# Patient Record
Sex: Male | Born: 1960 | Race: Black or African American | Hispanic: No | Marital: Single
Health system: Southern US, Community
[De-identification: ages and names within clinical notes are randomized; demographics above are authoritative.]

## PROBLEM LIST (undated history)

## (undated) DIAGNOSIS — E079 Disorder of thyroid, unspecified: Secondary | ICD-10-CM

## (undated) DIAGNOSIS — I4891 Unspecified atrial fibrillation: Secondary | ICD-10-CM

## (undated) DIAGNOSIS — C801 Malignant (primary) neoplasm, unspecified: Secondary | ICD-10-CM

## (undated) HISTORY — PX: HEMORRHOIDECTOMY WITH HEMORRHOID BANDING: SHX5633

## (undated) HISTORY — PX: NECK SURGERY: SHX720

## (undated) HISTORY — PX: CARDIAC SURGERY: SHX584

---

## 2013-01-24 DIAGNOSIS — C109 Malignant neoplasm of oropharynx, unspecified: Secondary | ICD-10-CM | POA: Insufficient documentation

## 2014-01-31 DIAGNOSIS — E039 Hypothyroidism, unspecified: Secondary | ICD-10-CM | POA: Insufficient documentation

## 2016-05-19 DIAGNOSIS — I10 Essential (primary) hypertension: Secondary | ICD-10-CM | POA: Insufficient documentation

## 2016-11-17 DIAGNOSIS — I480 Paroxysmal atrial fibrillation: Secondary | ICD-10-CM | POA: Insufficient documentation

## 2016-11-17 DIAGNOSIS — I4891 Unspecified atrial fibrillation: Secondary | ICD-10-CM | POA: Insufficient documentation

## 2017-02-23 DIAGNOSIS — R001 Bradycardia, unspecified: Secondary | ICD-10-CM | POA: Insufficient documentation

## 2017-04-13 DIAGNOSIS — I483 Typical atrial flutter: Secondary | ICD-10-CM | POA: Insufficient documentation

## 2020-01-31 ENCOUNTER — Encounter (HOSPITAL_COMMUNITY): Payer: Self-pay

## 2020-01-31 ENCOUNTER — Other Ambulatory Visit: Payer: Self-pay

## 2020-01-31 ENCOUNTER — Ambulatory Visit (HOSPITAL_COMMUNITY)
Admission: EM | Admit: 2020-01-31 | Discharge: 2020-01-31 | Disposition: A | Discharge status: Home / Self Care | Payer: Self-pay

## 2020-01-31 ENCOUNTER — Emergency Department (HOSPITAL_COMMUNITY): Payer: 59

## 2020-01-31 ENCOUNTER — Emergency Department (HOSPITAL_COMMUNITY)
Admission: EM | Admit: 2020-01-31 | Discharge: 2020-01-31 | Disposition: A | Discharge status: Home / Self Care | Payer: 59 | Attending: Emergency Medicine | Admitting: Emergency Medicine

## 2020-01-31 DIAGNOSIS — Y92410 Unspecified street and highway as the place of occurrence of the external cause: Secondary | ICD-10-CM | POA: Diagnosis not present

## 2020-01-31 DIAGNOSIS — S161XXA Strain of muscle, fascia and tendon at neck level, initial encounter: Secondary | ICD-10-CM | POA: Diagnosis not present

## 2020-01-31 DIAGNOSIS — Z23 Encounter for immunization: Secondary | ICD-10-CM | POA: Insufficient documentation

## 2020-01-31 DIAGNOSIS — S40211A Abrasion of right shoulder, initial encounter: Secondary | ICD-10-CM | POA: Diagnosis not present

## 2020-01-31 DIAGNOSIS — S0001XA Abrasion of scalp, initial encounter: Secondary | ICD-10-CM | POA: Diagnosis not present

## 2020-01-31 DIAGNOSIS — R519 Headache, unspecified: Secondary | ICD-10-CM | POA: Diagnosis not present

## 2020-01-31 DIAGNOSIS — Z7901 Long term (current) use of anticoagulants: Secondary | ICD-10-CM | POA: Diagnosis not present

## 2020-01-31 DIAGNOSIS — S199XXA Unspecified injury of neck, initial encounter: Secondary | ICD-10-CM | POA: Diagnosis present

## 2020-01-31 HISTORY — DX: Disorder of thyroid, unspecified: E07.9

## 2020-01-31 HISTORY — DX: Malignant (primary) neoplasm, unspecified: C80.1

## 2020-01-31 LAB — I-STAT CHEM 8, ED
BUN: 28 mg/dL — ABNORMAL HIGH (ref 8–23)
Calcium, Ion: 1.14 mmol/L — ABNORMAL LOW (ref 1.15–1.40)
Chloride: 104 mmol/L (ref 98–111)
Creatinine, Ser: 1.6 mg/dL — ABNORMAL HIGH (ref 0.61–1.24)
Glucose, Bld: 111 mg/dL — ABNORMAL HIGH (ref 70–99)
HCT: 39 % (ref 39.0–52.0)
Hemoglobin: 13.3 g/dL (ref 13.0–17.0)
Potassium: 4 mmol/L (ref 3.5–5.1)
Sodium: 139 mmol/L (ref 135–145)
TCO2: 26 mmol/L (ref 22–32)

## 2020-01-31 LAB — CBC
HCT: 37.9 % — ABNORMAL LOW (ref 39.0–52.0)
Hemoglobin: 11.9 g/dL — ABNORMAL LOW (ref 13.0–17.0)
MCH: 25.8 pg — ABNORMAL LOW (ref 26.0–34.0)
MCHC: 31.4 g/dL (ref 30.0–36.0)
MCV: 82 fL (ref 80.0–100.0)
Platelets: 328 10*3/uL (ref 150–400)
RBC: 4.62 MIL/uL (ref 4.22–5.81)
RDW: 13.6 % (ref 11.5–15.5)
WBC: 8.6 10*3/uL (ref 4.0–10.5)
nRBC: 0 % (ref 0.0–0.2)

## 2020-01-31 LAB — COMPREHENSIVE METABOLIC PANEL
ALT: 19 U/L (ref 0–44)
AST: 28 U/L (ref 15–41)
Albumin: 4.1 g/dL (ref 3.5–5.0)
Alkaline Phosphatase: 44 U/L (ref 38–126)
Anion gap: 9 (ref 5–15)
BUN: 24 mg/dL — ABNORMAL HIGH (ref 8–23)
CO2: 28 mmol/L (ref 22–32)
Calcium: 10.1 mg/dL (ref 8.9–10.3)
Chloride: 102 mmol/L (ref 98–111)
Creatinine, Ser: 1.63 mg/dL — ABNORMAL HIGH (ref 0.61–1.24)
GFR calc non Af Amer: 45 mL/min — ABNORMAL LOW (ref >60)
Glucose, Bld: 128 mg/dL — ABNORMAL HIGH (ref 70–99)
Potassium: 3.9 mmol/L (ref 3.5–5.1)
Sodium: 139 mmol/L (ref 135–145)
Total Bilirubin: 0.3 mg/dL (ref 0.3–1.2)
Total Protein: 7.5 g/dL (ref 6.5–8.1)

## 2020-01-31 LAB — PROTIME-INR
INR: 1.3 — ABNORMAL HIGH (ref 0.8–1.2)
Prothrombin Time: 15.6 seconds — ABNORMAL HIGH (ref 11.4–15.2)

## 2020-01-31 LAB — URINALYSIS, ROUTINE W REFLEX MICROSCOPIC
Bilirubin Urine: NEGATIVE
Glucose, UA: NEGATIVE mg/dL
Hgb urine dipstick: NEGATIVE
Ketones, ur: NEGATIVE mg/dL
Leukocytes,Ua: NEGATIVE
Nitrite: NEGATIVE
Protein, ur: NEGATIVE mg/dL
Specific Gravity, Urine: 1.012 (ref 1.005–1.030)
pH: 5 (ref 5.0–8.0)

## 2020-01-31 LAB — ETHANOL: Alcohol, Ethyl (B): <10 mg/dL (ref <10)

## 2020-01-31 LAB — SAMPLE TO BLOOD BANK

## 2020-01-31 MED ORDER — TETANUS-DIPHTH-ACELL PERTUSSIS 5-2.5-18.5 LF-MCG/0.5 IM SUSP
0.5000 mL | Freq: Once | INTRAMUSCULAR | Status: AC
  Administered 2020-01-31: 0.5 mL via INTRAMUSCULAR
  Filled 2020-01-31: qty 0.5

## 2020-01-31 MED ORDER — MORPHINE SULFATE (PF) 4 MG/ML IV SOLN
4.0000 mg | Freq: Once | INTRAVENOUS | Status: AC
  Administered 2020-01-31: 4 mg via INTRAVENOUS
  Filled 2020-01-31: qty 1

## 2020-01-31 NOTE — ED Notes (Signed)
Pt d.c by MD & is giving d.c instructions and follow up care, Pt out of The ED ambulatory

## 2020-01-31 NOTE — ED Notes (Signed)
Patient transported to CT 
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
## 2020-01-31 NOTE — Discharge Instructions (Signed)
You did not have any injuries on your x-rays and CT scans.  You may take 2 extra strength Tylenol and ibuprofen every 6-8 hours for pain at home.  You may also use ice on the neck for 15 minutes at a time to help with pain and swelling.  You will likely feel worse tomorrow, but pain should start to improve in the next several days.  Please follow-up with your primary care doctor in a week's time if you are not feeling better.
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
## 2020-03-17 ENCOUNTER — Encounter: Payer: Self-pay | Admitting: Podiatry

## 2020-03-17 ENCOUNTER — Ambulatory Visit (INDEPENDENT_AMBULATORY_CARE_PROVIDER_SITE_OTHER): Payer: 59 | Admitting: Podiatry

## 2020-03-17 ENCOUNTER — Other Ambulatory Visit: Payer: Self-pay

## 2020-03-17 DIAGNOSIS — Z7901 Long term (current) use of anticoagulants: Secondary | ICD-10-CM | POA: Diagnosis not present

## 2020-03-17 DIAGNOSIS — E1142 Type 2 diabetes mellitus with diabetic polyneuropathy: Secondary | ICD-10-CM

## 2020-03-17 DIAGNOSIS — M79674 Pain in right toe(s): Secondary | ICD-10-CM | POA: Diagnosis not present

## 2020-03-17 DIAGNOSIS — M79675 Pain in left toe(s): Secondary | ICD-10-CM

## 2020-03-17 DIAGNOSIS — B351 Tinea unguium: Secondary | ICD-10-CM | POA: Diagnosis not present

## 2020-03-17 NOTE — Progress Notes (Signed)
  Subjective:  Patient ID: Gary Lawrence, male    DOB: 1960-08-30,  MRN: [id number]  Chief Complaint  Patient presents with  . routine foot care    nail trim     59 y.o. male presents with the above complaint. History confirmed with patient.  He has thickened discolored and uncomfortable toenails.  He is a type II diabetic and thinks his A1c is "pretty good".  Does not know the number.  He takes Xarelto for cardiac issues.  He last saw his PCP in October 2021  Objective:  Physical Exam: warm, good capillary refill, no trophic changes or ulcerative lesions and normal DP and PT pulses.  Decreased protective sensation at tips of toes.  Onychomycosis bilaterally 1, 2, 5 and 3 on the right side  Assessment:  No diagnosis found.   Plan:  Patient was evaluated and treated and all questions answered.  Discussed the etiology and treatment options for the condition in detail with the patient. Educated patient on the topical and oral treatment options for mycotic nails. Recommended debridement of the nails today. Sharp and mechanical debridement performed of all painful and mycotic nails today. Nails debrided in length and thickness using a nail nipper and a mechanical burr to level of comfort. Discussed treatment options including appropriate shoe gear. Follow up as needed for painful nails.  Patient educated on diabetes. Discussed proper diabetic foot care and discussed risks and complications of disease. Educated patient in depth on reasons to return to the office immediately should he/she discover anything concerning or new on the feet. All questions answered. Discussed proper shoes as well.    Return in about 3 months (around 06/17/2020).

## 2020-04-24 ENCOUNTER — Emergency Department (HOSPITAL_COMMUNITY): Payer: 59

## 2020-04-24 ENCOUNTER — Encounter (HOSPITAL_COMMUNITY): Payer: Self-pay | Admitting: *Deleted

## 2020-04-24 ENCOUNTER — Emergency Department (HOSPITAL_COMMUNITY)
Admission: EM | Admit: 2020-04-24 | Discharge: 2020-04-24 | Disposition: A | Discharge status: Home / Self Care | Payer: 59 | Attending: Emergency Medicine | Admitting: Emergency Medicine

## 2020-04-24 DIAGNOSIS — Y92410 Unspecified street and highway as the place of occurrence of the external cause: Secondary | ICD-10-CM | POA: Insufficient documentation

## 2020-04-24 DIAGNOSIS — I10 Essential (primary) hypertension: Secondary | ICD-10-CM | POA: Diagnosis not present

## 2020-04-24 DIAGNOSIS — S01112A Laceration without foreign body of left eyelid and periocular area, initial encounter: Secondary | ICD-10-CM | POA: Insufficient documentation

## 2020-04-24 DIAGNOSIS — S0990XA Unspecified injury of head, initial encounter: Secondary | ICD-10-CM | POA: Diagnosis present

## 2020-04-24 DIAGNOSIS — S00511A Abrasion of lip, initial encounter: Secondary | ICD-10-CM | POA: Insufficient documentation

## 2020-04-24 DIAGNOSIS — E119 Type 2 diabetes mellitus without complications: Secondary | ICD-10-CM | POA: Insufficient documentation

## 2020-04-24 DIAGNOSIS — T1490XA Injury, unspecified, initial encounter: Secondary | ICD-10-CM

## 2020-04-24 DIAGNOSIS — K429 Umbilical hernia without obstruction or gangrene: Secondary | ICD-10-CM | POA: Diagnosis not present

## 2020-04-24 HISTORY — DX: Unspecified atrial fibrillation: I48.91

## 2020-04-24 LAB — COMPREHENSIVE METABOLIC PANEL
ALT: 26 U/L (ref 0–44)
AST: 30 U/L (ref 15–41)
Albumin: 3.9 g/dL (ref 3.5–5.0)
Alkaline Phosphatase: 53 U/L (ref 38–126)
Anion gap: 10 (ref 5–15)
BUN: 19 mg/dL (ref 8–23)
CO2: 24 mmol/L (ref 22–32)
Calcium: 9.7 mg/dL (ref 8.9–10.3)
Chloride: 105 mmol/L (ref 98–111)
Creatinine, Ser: 1.54 mg/dL — ABNORMAL HIGH (ref 0.61–1.24)
GFR, Estimated: 51 mL/min — ABNORMAL LOW (ref >60)
Glucose, Bld: 163 mg/dL — ABNORMAL HIGH (ref 70–99)
Potassium: 3.6 mmol/L (ref 3.5–5.1)
Sodium: 139 mmol/L (ref 135–145)
Total Bilirubin: 0.6 mg/dL (ref 0.3–1.2)
Total Protein: 6.9 g/dL (ref 6.5–8.1)

## 2020-04-24 LAB — SAMPLE TO BLOOD BANK

## 2020-04-24 LAB — URINALYSIS, ROUTINE W REFLEX MICROSCOPIC
Bacteria, UA: NONE SEEN
Bilirubin Urine: NEGATIVE
Glucose, UA: NEGATIVE mg/dL
Hgb urine dipstick: NEGATIVE
Ketones, ur: NEGATIVE mg/dL
Leukocytes,Ua: NEGATIVE
Nitrite: NEGATIVE
Protein, ur: 100 mg/dL — AB
Specific Gravity, Urine: 1.03 (ref 1.005–1.030)
pH: 5 (ref 5.0–8.0)

## 2020-04-24 LAB — CBC
HCT: 33.4 % — ABNORMAL LOW (ref 39.0–52.0)
Hemoglobin: 10.9 g/dL — ABNORMAL LOW (ref 13.0–17.0)
MCH: 26.8 pg (ref 26.0–34.0)
MCHC: 32.6 g/dL (ref 30.0–36.0)
MCV: 82.3 fL (ref 80.0–100.0)
Platelets: 317 10*3/uL (ref 150–400)
RBC: 4.06 MIL/uL — ABNORMAL LOW (ref 4.22–5.81)
RDW: 14.7 % (ref 11.5–15.5)
WBC: 6.8 10*3/uL (ref 4.0–10.5)
nRBC: 0 % (ref 0.0–0.2)

## 2020-04-24 LAB — I-STAT CHEM 8, ED
BUN: 21 mg/dL (ref 8–23)
Calcium, Ion: 1.24 mmol/L (ref 1.15–1.40)
Chloride: 106 mmol/L (ref 98–111)
Creatinine, Ser: 1.4 mg/dL — ABNORMAL HIGH (ref 0.61–1.24)
Glucose, Bld: 155 mg/dL — ABNORMAL HIGH (ref 70–99)
HCT: 35 % — ABNORMAL LOW (ref 39.0–52.0)
Hemoglobin: 11.9 g/dL — ABNORMAL LOW (ref 13.0–17.0)
Potassium: 3.6 mmol/L (ref 3.5–5.1)
Sodium: 142 mmol/L (ref 135–145)
TCO2: 23 mmol/L (ref 22–32)

## 2020-04-24 LAB — ETHANOL: Alcohol, Ethyl (B): <10 mg/dL (ref <10)

## 2020-04-24 LAB — LACTIC ACID, PLASMA: Lactic Acid, Venous: 2.3 mmol/L (ref 0.5–1.9)

## 2020-04-24 LAB — PROTIME-INR
INR: 1 (ref 0.8–1.2)
Prothrombin Time: 13.2 seconds (ref 11.4–15.2)

## 2020-04-24 MED ORDER — LIDOCAINE HCL (PF) 1 % IJ SOLN
30.0000 mL | Freq: Once | INTRAMUSCULAR | Status: AC
  Administered 2020-04-24: 30 mL
  Filled 2020-04-24: qty 30

## 2020-04-24 MED ORDER — CEPHALEXIN 500 MG PO CAPS: 500.0000 mg | ORAL_CAPSULE | Freq: Four times a day (QID) | ORAL | 0 refills | Status: AC

## 2020-04-24 MED ORDER — IOHEXOL 350 MG/ML SOLN
100.0000 mL | Freq: Once | INTRAVENOUS | Status: AC | PRN
  Administered 2020-04-24: 100 mL via INTRAVENOUS

## 2020-04-24 NOTE — Discharge Instructions (Addendum)
Thank you for choosing [facility] for your care today.  To do: Your sutures are absorbable - they will dissolve on their own over the next 7-14 days.  You do not need to come back to have them removed.  Do not submerge your head in water or vigorously scrub this area until the sutures are dissolved. You can take tylenol every 6 hours for pain. You will probably feel more sore tomorrow and over the next few days than you do today.  Your kidney mass was noted on imaging again today. You should follow up with your primary care provider regarding this finding to plan follow up and any more imaging. Please follow up with your primary care doctor in the next few days. If you do not have a PCP you are established with, you can call [phone]  or [phone]  to access [facility] Find A Doctor service. You can also visit [email or web address] Please come back to the Emergency Department if you have shortness of breath, chest pain, confusion/mental status changes, if you have so much nausea/vomiting that you cannot keep down fluids, or if you have any other symptoms that worry you.  Take care. Hope you start feeling better soon.  Corliss Blacker, MD Emergency Medicine

## 2020-04-24 NOTE — Progress Notes (Signed)
Orthopedic Tech Progress Note Patient Details:  Lyfe Monger Dec 31, 1960 [id number]          Michelle Piper 04/24/2020, 6:13 PM
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
## 2020-04-24 NOTE — ED Notes (Signed)
Patient transported to X-ray 
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
## 2020-04-24 NOTE — Progress Notes (Signed)
Orthopedic Tech Progress Note Patient Details:  Gary Lawrence 27-Aug-1960 [id number]  Ortho Devices Type of Ortho Device: Sling immobilizer Ortho Device/Splint Location: RUE Ortho Device/Splint Interventions: Application,Adjustment   Post Interventions Patient Tolerated: Well Instructions Provided: Adjustment of device   Gary Lawrence E Dejon Lukas 04/24/2020, 10:26 PM
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
## 2020-04-24 NOTE — ED Notes (Signed)
Pt's brother called to get an update. Callback number is [phone], Dorinda Hill
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
## 2020-04-24 NOTE — ED Notes (Signed)
C-collar applied

## 2020-05-28 ENCOUNTER — Encounter: Payer: Self-pay | Admitting: Gastroenterology

## 2020-05-28 ENCOUNTER — Ambulatory Visit: Payer: Medicaid Other | Admitting: Gastroenterology

## 2020-05-28 ENCOUNTER — Other Ambulatory Visit: Payer: Self-pay

## 2020-05-28 VITALS — BP 120/80 | HR 62 | Ht 79.0 in | Wt 236.0 lb

## 2020-05-28 DIAGNOSIS — Z86010 Personal history of colonic polyps: Secondary | ICD-10-CM

## 2020-05-28 MED ORDER — SUPREP BOWEL PREP KIT 17.5-3.13-1.6 GM/177ML PO SOLN: 1.0000 | ORAL | 0 refills | Status: DC

## 2020-05-28 NOTE — Progress Notes (Signed)
HPI: This is a very pleasant 60 year old man who was referred to me by Drue Flirt, MD  to evaluate history of rectal polyp.    He was referred by his primary care physician.  I reviewed an office note from October 2021 which reads "colonoscopy-first done in 2019 and then repeat last year, both while incarcerated (records not available) and he was told he needs to get another this year in 2021 however unsure why"  He is on Xarelto for atrial fibrillation.  He brought with him a copy of a colonoscopy report from [facility].  Colonoscopy 11/2018.  Findings "a 35 mm polyp was found in the proximal rectum at 10 cm from the anal verge distal to the site of previous tattooing.  The polyp was pedunculated.  The polyp was removed with a hot snare.  Resection and retrieval were complete.  To prevent bleeding the polypectomy tissue edges were approximated and 112 mm over the scope clip was successfully placed.  There was no bleeding during the procedure.  The area 2 cm distal to the polypectomy site was tattooed with an injection of spot.  The examination was to the cecum."  No pathology results were available at the time of this visit  Colon cancer does not run in his family.  He does have intermittent bright red blood per rectum and says he has drainage from his bottom which requires him to wear an adult diaper periodically.  Review of systems: Pertinent positive and negative review of systems were noted in the above HPI section. All other review negative.   Past Medical History:  Diagnosis Date  . A-fib ([place])   . Cancer ([place])   . Thyroid disease     Past Surgical History:  Procedure Laterality Date  . CARDIAC SURGERY      Current Outpatient Medications  Medication Sig Dispense Refill  . acetaminophen (TYLENOL) 325 MG suppository Place 325 mg rectally every 4 (four) hours as needed.    Marland Kitchen acetaminophen (TYLENOL) 650 MG CR tablet Take 650 mg by mouth every 8 (eight) hours as needed for  pain.    Marland Kitchen amLODipine (NORVASC) 10 MG tablet Take 10 mg by mouth daily.    . brimonidine (ALPHAGAN) 0.15 % ophthalmic solution SMARTSIG:In Eye(s)    . chlorthalidone (HYGROTON) 25 MG tablet Take 25 mg by mouth daily.    . Cholecalciferol (VITAMIN D3) 25 MCG (1000 UT) CAPS Take 1 capsule by mouth daily.    . enalapril (VASOTEC) 10 MG tablet Take by mouth.    . fenofibrate 54 MG tablet Take 54 mg by mouth daily.    . ferrous sulfate 324 MG TBEC Take 324 mg by mouth.    . fluticasone (FLONASE) 50 MCG/ACT nasal spray Place 1 spray into both nostrils daily.    Marland Kitchen glipiZIDE (GLUCOTROL XL) 2.5 MG 24 hr tablet Take 2.5 mg by mouth daily.    . metFORMIN (GLUCOPHAGE) 1000 MG tablet Take 1,000 mg by mouth 2 (two) times daily.    . metoprolol tartrate (LOPRESSOR) 100 MG tablet Take 100 mg by mouth 2 (two) times daily.    . niacin 500 MG tablet Take 500 mg by mouth at bedtime.    . pantoprazole (PROTONIX) 40 MG tablet Take 40 mg by mouth daily.    . rivaroxaban (XARELTO) 10 MG TABS tablet Take 10 mg by mouth daily.    . timolol (BETIMOL) 0.5 % ophthalmic solution 1 drop 2 (two) times daily.  No current facility-administered medications for this visit.    Allergies as of 05/28/2020  . (No Known Allergies)    History reviewed. No pertinent family history.  Social History   Socioeconomic History  . Marital status: Single    Spouse name: Not on file  . Number of children: Not on file  . Years of education: Not on file  . Highest education level: Not on file  Occupational History  . Not on file  Tobacco Use  . Smoking status: Never Smoker  . Smokeless tobacco: Not on file  Substance and Sexual Activity  . Alcohol use: Never  . Drug use: Never  . Sexual activity: Not on file  Other Topics Concern  . Not on file  Social History Narrative   ** Merged History Encounter **       Social Determinants of Health   Financial Resource Strain: Not on file  Food Insecurity: Not on file   Transportation Needs: Not on file  Physical Activity: Not on file  Stress: Not on file  Social Connections: Not on file  Intimate Partner Violence: Not on file     Physical Exam: Ht 6\' 7"  (2.007 m)   Wt 236 lb (107 kg)   BMI 26.59 kg/m  Constitutional: generally well-appearing Psychiatric: alert and oriented x3 Eyes: extraocular movements intact Mouth: oral pharynx moist, no lesions Neck: supple no lymphadenopathy Cardiovascular: heart regular rate and rhythm Lungs: clear to auscultation bilaterally Abdomen: soft, nontender, nondistended, no obvious ascites, no peritoneal signs, normal bowel sounds Extremities: no lower extremity edema bilaterally Skin: no lesions on visible extremities Rectal exam deferred for upcoming colonoscopy  Assessment and plan: 60 y.o. male with history of 3.5 cm rectal polyp removed at [facility] in 2020  It would be very helpful to have his previous pathology reports for [facility] and we will strive to get those results here.  Clearly he needs another colonoscopy to inspect that site as well as the rest of his colon we will arrange for that to be done at his soonest convenience.  He is on a blood thinner Xarelto and knows that that will cause him to be at increased risk for procedure related bleeding and so I asked that he hold it for 2 days prior to his colonoscopy.  We check with his [facility] cardiologist to make sure that he is okay with that recommendation.  Please see the "Patient Instructions" section for addition details about the plan.   Gary Loffler, MD [facility] 05/28/2020, 9:51 AM  Cc: Drue Flirt, MD  Total time on date of encounter was 45 minutes (this included time spent preparing to see the patient reviewing records; obtaining and/or reviewing separately obtained history; performing a medically appropriate exam and/or evaluation; counseling and educating the patient and family if present; ordering medications, tests or procedures  if applicable; and documenting clinical information in the health record).

## 2020-05-28 NOTE — Patient Instructions (Addendum)
If you are age 62 or younger, your body mass index should be between 19-25. Your Body mass index is 26.59 kg/m. If this is out of the aformentioned range listed, please consider follow up with your Primary Care Provider.   You have been scheduled for a colonoscopy. Please follow written instructions given to you at your visit today.  Please pick up your prep supplies at the pharmacy within the next 1-3 days. If you use inhalers (even only as needed), please bring them with you on the day of your procedure.  Due to recent changes in healthcare laws, you may see the results of your imaging and laboratory studies on MyChart before your provider has had a chance to review them.  We understand that in some cases there may be results that are confusing or concerning to you. Not all laboratory results come back in the same time frame and the provider may be waiting for multiple results in order to interpret others.  Please give [place] 48 hours in order for your provider to thoroughly review all the results before contacting the office for clarification of your results.   We will try to obtain pathology report from [facility] for Colonoscopy done in 2020.  Thank you for entrusting me with your care and choosing [facility].  Dr Ardis Hughs

## 2020-05-29 ENCOUNTER — Telehealth: Payer: Self-pay

## 2020-05-29 NOTE — Telephone Encounter (Signed)
   Gary Lawrence July 09, 1960 [id number]  Dear Dr Luana Shu,   We have scheduled the above named patient for a(n) colonoscopu procedure. Our records show that (s)he is on anticoagulation therapy.  Please advise as to whether the patient may come off their therapy of Xarelto for 2 days prior to their procedure which is scheduled for 07-15-2020.  Please fax response to [phone].  ATTNElmyra Ricks, CMA  Sincerely,    [facility]

## 2020-06-10 ENCOUNTER — Telehealth: Payer: Self-pay | Admitting: Gastroenterology

## 2020-06-10 NOTE — Telephone Encounter (Signed)
Colonoscopy August 2020 [facility] indication "therapeutic procedure for known rectal mass" findings 135 mm polyp in the rectum, removed with a hot snare, resected and retrieved.  Treated with a hot snare.  Clip (MR conditional and (was placed.  Tattooed.  This examination was to the cecum.  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  The lateral and deep resection margins are negative for adenoma and for high-grade dysplasia".  It looks like he was recommended to have a repeat colonoscopy at 1 year interval.

## 2020-06-23 ENCOUNTER — Encounter: Payer: Self-pay | Admitting: Podiatry

## 2020-06-23 ENCOUNTER — Ambulatory Visit (INDEPENDENT_AMBULATORY_CARE_PROVIDER_SITE_OTHER): Payer: Medicaid Other | Admitting: Podiatry

## 2020-06-23 ENCOUNTER — Other Ambulatory Visit: Payer: Self-pay

## 2020-06-23 DIAGNOSIS — M79675 Pain in left toe(s): Secondary | ICD-10-CM

## 2020-06-23 DIAGNOSIS — M79674 Pain in right toe(s): Secondary | ICD-10-CM

## 2020-06-23 DIAGNOSIS — B351 Tinea unguium: Secondary | ICD-10-CM | POA: Diagnosis not present

## 2020-06-23 DIAGNOSIS — Z7901 Long term (current) use of anticoagulants: Secondary | ICD-10-CM

## 2020-06-23 DIAGNOSIS — E1142 Type 2 diabetes mellitus with diabetic polyneuropathy: Secondary | ICD-10-CM | POA: Diagnosis not present

## 2020-06-23 MED ORDER — TOLNAFTATE 1 % EX AERO: INHALATION_SPRAY | CUTANEOUS | 0 refills | Status: DC

## 2020-06-23 MED ORDER — KETOCONAZOLE 2 % EX CREA: 1.0000 "application " | TOPICAL_CREAM | Freq: Every day | CUTANEOUS | 2 refills | Status: DC

## 2020-06-23 NOTE — Progress Notes (Signed)
  Subjective:  Patient ID: Gary Lawrence, male    DOB: 1960-08-08,  MRN: [id number]  Chief Complaint  Patient presents with  . routine foot care    Nail trim     60 y.o. male returns with the above complaint. History confirmed with patient.  He has thickened discolored and uncomfortable toenails.  Debridement last time was helpful  Objective:  Physical Exam: warm, good capillary refill, no trophic changes or ulcerative lesions and normal DP and PT pulses.  Decreased protective sensation at tips of toes.  Onychomycosis bilaterally 1, 2, 5 and 3 on the right side  Assessment:   1. Pain due to onychomycosis of toenails of both feet   2. Type 2 diabetes mellitus with diabetic polyneuropathy, without long-term current use of insulin ([place])   3. Long term current use of anticoagulant   4. Onychomycosis      Plan:  Patient was evaluated and treated and all questions answered.  Discussed the etiology and treatment options for the condition in detail with the patient. Educated patient on the topical and oral treatment options for mycotic nails. Recommended debridement of the nails today. Sharp and mechanical debridement performed of all painful and mycotic nails today. Nails debrided in length and thickness using a nail nipper and a mechanical burr to level of comfort. Discussed treatment options including appropriate shoe gear. Follow up as needed for painful nails.  Patient educated on diabetes. Discussed proper diabetic foot care and discussed risks and complications of disease. Educated patient in depth on reasons to return to the office immediately should he/she discover anything concerning or new on the feet. All questions answered. Discussed proper shoes as well.    Return in about 3 months (around 09/20/2020) for at risk diabetic foot care.

## 2020-06-23 NOTE — Patient Instructions (Signed)

## 2020-07-04 ENCOUNTER — Telehealth: Payer: Self-pay

## 2020-07-04 NOTE — Telephone Encounter (Signed)
Office called to advise the fax was received

## 2020-07-04 NOTE — Telephone Encounter (Signed)
Anticoagulation letter faxed to [phone] on 05-29-2020 and 06-23-2020 with no response.  Called [phone] and left message on nurse line voicemail for nurse to return my call or fax clearance to our office.  Will refax clearance form on 07-07-20 as fax lines are down today.

## 2020-07-04 NOTE — Telephone Encounter (Signed)
Will continue to await response.

## 2020-07-07 NOTE — Telephone Encounter (Signed)
Received fax stating that patient has not been seen by Dr Luana Shu in greater than 2 years.  Dr Luana Shu requested that cardiac clearance be sent to PCP or current cardiologist.    Left message at [phone] for patient to return call to office to verify PCP or current cardiologist.   Attempted to reach patient at [phone] but there was no answer and voicemail was not set up. Will continue efforts.

## 2020-07-08 NOTE — Telephone Encounter (Signed)
Returned call to patient to notify him that Dr Luana Shu has not seen him in greater than 2 years and will not clear him to hold Xarelto prior to procedure. Patient states that he does not have PCP or another cardiologist.  He states he will contact [facility] office to see if he can get a follow up appointment this week to get clearance.  Patient advised that I will follow up with him later on in the week to see if appointment was made.  If no appointment has been made, we will reschedule patient to a later date.  Patient agreed to plan and verbalized understanding.  No further questions.

## 2020-07-11 ENCOUNTER — Telehealth: Payer: Self-pay | Admitting: Gastroenterology

## 2020-07-11 NOTE — Telephone Encounter (Signed)
Left message for patient to return call to inform our office if he was able to be seen this week by Dr Luana Shu.  If patient has not been cleared he will need procedure for 07-15-20 rescheduled. Will continue efforts.

## 2020-07-11 NOTE — Telephone Encounter (Signed)
Left message for patient to return call to discuss holding Xarelto prior to procedure. Will continue efforts. 

## 2020-07-14 NOTE — Telephone Encounter (Signed)
Left message for patient to return call to discuss holding Xarelto prior to procedure. Will continue efforts. 

## 2020-07-15 ENCOUNTER — Encounter: Payer: Medicaid Other | Admitting: Gastroenterology

## 2020-07-15 NOTE — Telephone Encounter (Signed)
Attempted to reach patient by phone.  Call to [phone] mailbox full and unable to leave message.  Call to [phone] phone rang several times (there was no answer), then went to busy signal. Will continue efforts.

## 2020-07-16 DIAGNOSIS — Z9889 Other specified postprocedural states: Secondary | ICD-10-CM | POA: Insufficient documentation

## 2020-07-16 NOTE — Telephone Encounter (Signed)
Patient returned your call. Informed patient you would try to call back around lunch.

## 2020-07-16 NOTE — Telephone Encounter (Signed)
Patient advised that he can hold Xarelto for 2 days prior to procedure per Victorino Sparrow, NP at [facility].  Patient advised to take last dose of Xarelto on 07-18-2020, and he will be instructed when to restart after the procedure by Dr Ardis Hughs.  Patient agreed to plan and verbalized understanding.  No further questions.

## 2020-07-16 NOTE — Telephone Encounter (Signed)
Left message for patient to return call to discuss holding Xarelto prior to procedure. Will continue efforts.

## 2020-07-16 NOTE — Telephone Encounter (Signed)
Patient advised that he can hold Xarelto for 2 days prior to procedure per Victorino Sparrow, NP at [facility].  Patient advised to take last dose of Xarelto on 07-18-2020, and he will be instructed when to restart after the procedure by Dr Ardis Hughs.  Patient agreed to plan and verbalized understanding.  No further questions.

## 2020-07-17 DIAGNOSIS — E119 Type 2 diabetes mellitus without complications: Secondary | ICD-10-CM | POA: Insufficient documentation

## 2020-07-17 DIAGNOSIS — K219 Gastro-esophageal reflux disease without esophagitis: Secondary | ICD-10-CM | POA: Insufficient documentation

## 2020-07-20 ENCOUNTER — Encounter: Payer: Self-pay | Admitting: Certified Registered Nurse Anesthetist

## 2020-07-21 ENCOUNTER — Ambulatory Visit (AMBULATORY_SURGERY_CENTER): Payer: Medicaid Other | Admitting: Gastroenterology

## 2020-07-21 ENCOUNTER — Other Ambulatory Visit: Payer: Self-pay

## 2020-07-21 ENCOUNTER — Encounter: Payer: Self-pay | Admitting: Gastroenterology

## 2020-07-21 VITALS — BP 117/59 | HR 51 | Temp 97.3°F | Resp 13 | Ht 79.0 in | Wt 236.0 lb

## 2020-07-21 DIAGNOSIS — K635 Polyp of colon: Secondary | ICD-10-CM

## 2020-07-21 DIAGNOSIS — Z86010 Personal history of colonic polyps: Secondary | ICD-10-CM

## 2020-07-21 DIAGNOSIS — D123 Benign neoplasm of transverse colon: Secondary | ICD-10-CM

## 2020-07-21 MED ORDER — SODIUM CHLORIDE 0.9 % IV SOLN: 500.0000 mL | Freq: Once | INTRAVENOUS | Status: DC

## 2020-07-21 NOTE — Progress Notes (Signed)
Called to room to assist during endoscopic procedure.  Patient ID and intended procedure confirmed with present staff. Received instructions for my participation in the procedure from the performing physician.  

## 2020-07-21 NOTE — Progress Notes (Signed)
No problems noted in the recovery room. maw 

## 2020-07-21 NOTE — Patient Instructions (Addendum)
Handouts were given to your care partner on polyps, diverticulosis, and hemorrhoids. Your sugar was 108. Restart your XARELTO on 07/22/20 tomorrow per Dr. Ardis Hughs. You may resume your current medications today. Await biopsy results.  May take 1-3 weeks to receive pathology results. Please call if any questions or concerns.    YOU HAD AN ENDOSCOPIC PROCEDURE TODAY AT [facility]:   Refer to the procedure report that was given to you for any specific questions about what was found during the examination.  If the procedure report does not answer your questions, please call your gastroenterologist to clarify.  If you requested that your care partner not be given the details of your procedure findings, then the procedure report has been included in a sealed envelope for you to review at your convenience later.  YOU SHOULD EXPECT: Some feelings of bloating in the abdomen. Passage of more gas than usual.  Walking can help get rid of the air that was put into your GI tract during the procedure and reduce the bloating. If you had a lower endoscopy (such as a colonoscopy or flexible sigmoidoscopy) you may notice spotting of blood in your stool or on the toilet paper. If you underwent a bowel prep for your procedure, you may not have a normal bowel movement for a few days.  Please Note:  You might notice some irritation and congestion in your nose or some drainage.  This is from the oxygen used during your procedure.  There is no need for concern and it should clear up in a day or so.  SYMPTOMS TO REPORT IMMEDIATELY:   Following lower endoscopy (colonoscopy or flexible sigmoidoscopy):  Excessive amounts of blood in the stool  Significant tenderness or worsening of abdominal pains  Swelling of the abdomen that is new, acute  Fever of 100F or higher   For urgent or emergent issues, a gastroenterologist can be reached at any hour by calling [phone]. Do not use MyChart messaging for  urgent concerns.    DIET:  We do recommend a small meal at first, but then you may proceed to your regular diet.  Drink plenty of fluids but you should avoid alcoholic beverages for 24 hours.  ACTIVITY:  You should plan to take it easy for the rest of today and you should NOT DRIVE or use heavy machinery until tomorrow (because of the sedation medicines used during the test).    FOLLOW UP: Our staff will call the number listed on your records 48-72 hours following your procedure to check on you and address any questions or concerns that you may have regarding the information given to you following your procedure. If we do not reach you, we will leave a message.  We will attempt to reach you two times.  During this call, we will ask if you have developed any symptoms of COVID 19. If you develop any symptoms (ie: fever, flu-like symptoms, shortness of breath, cough etc.) before then, please call [phone].  If you test positive for Covid 19 in the 2 weeks post procedure, please call and report this information to [place].    If any biopsies were taken you will be contacted by phone or by letter within the next 1-3 weeks.  Please call us at [phone] if you have not heard about the biopsies in 3 weeks.    SIGNATURES/CONFIDENTIALITY: You and/or your care partner have signed paperwork which will be entered into your electronic medical record.  These signatures  attest to the fact that that the information above on your After Visit Summary has been reviewed and is understood.  Full responsibility of the confidentiality of this discharge information lies with you and/or your care-partner.

## 2020-07-21 NOTE — Progress Notes (Signed)
Report given to PACU, vss 

## 2020-07-21 NOTE — Op Note (Signed)
[place] Patient Name: Gary Lawrence Procedure Date: 07/21/2020 7:55 AM MRN: [id number] Endoscopist: Milus Banister , MD Age: 60 Referring MD:  Date of Birth: 1960-11-22 Gender: Male Account #: [id number] Procedure:                Colonoscopy Indications:              High risk colon cancer surveillance: Personal                            history of colonic polyps; Colonoscopy August 2020                            [facility] indication "therapeutic procedure for known                            rectal mass" findings one 35 mm polyp in the                            rectum, removed with a hot snare, resected and                            retrieved. Treated with a hot snare. Clip (MR                            conditional and (was placed. Tattooed." This                            examination was to the cecum. Pathology report                            reads "traditional serrated adenoma with multifocal                            high-grade dysplasia. The lateral and deep                            resection margins are negative for adenoma and for                            high-grade dysplasia". It looks like he was                            recommended to have a repeat colonoscopy at 1 year                            interval. The clip was an "Over the Scope Clip"                            placed on stalk of pedunculated polyp. Medicines:                Monitored Anesthesia Care Procedure:                Pre-Anesthesia Assessment:                           -  Prior to the procedure, a History and Physical                            was performed, and patient medications and                            allergies were reviewed. The patient's tolerance of                            previous anesthesia was also reviewed. The risks                            and benefits of the procedure and the sedation                            options and risks were discussed with the  patient.                            All questions were answered, and informed consent                            was obtained. Prior Anticoagulants: The patient has                            taken Xarelto (rivaroxaban), last dose was 2 days                            prior to procedure. ASA Grade Assessment: III - A                            patient with severe systemic disease. After                            reviewing the risks and benefits, the patient was                            deemed in satisfactory condition to undergo the                            procedure.                           After obtaining informed consent, the colonoscope                            was passed under direct vision. Throughout the                            procedure, the patient's blood pressure, pulse, and                            oxygen saturations were monitored continuously. The  Olympus [id number]) Colonoscope was                            introduced through the anus and advanced to the the                            cecum, identified by appendiceal orifice and                            ileocecal valve. The colonoscopy was performed                            without difficulty. The patient tolerated the                            procedure well. The quality of the bowel                            preparation was good. The ileocecal valve,                            appendiceal orifice, and rectum were photographed. Scope In: 7:57:36 AM Scope Out: 8:14:16 AM Scope Withdrawal Time: 0 hours 13 minutes 39 seconds  Total Procedure Duration: 0 hours 16 minutes 40 seconds  Findings:                 The site of 2020 [facility] polypectomy was easily located                            with previous tattoo and still present large clip                            device. There was about 1cm of nodular soft tissue                            growth, residual at the clip and  this was sampled                            with forceps. jar 2                           Three sessile polyps were found in the transverse                            colon. The polyps were 3 to 6 mm in size. These                            polyps were removed with a cold snare. Resection                            and retrieval were complete. jar 1                           Multiple  small and large-mouthed diverticula were                            found in the left colon.                           Internal hemorrhoids.                           The exam was otherwise without abnormality on                            direct and retroflexion views. Complications:            No immediate complications. Estimated blood loss:                            None. Estimated Blood Loss:     Estimated blood loss: none. Impression:               - Three 3 to 6 mm polyps in the transverse colon,                            removed with a cold snare. Resected and retrieved.                            jar 1                           - The site of 2020 [facility] polypectomy was easily                            located with previous tattoo and still present                            large clip device. There was about 1cm of nodular                            soft tissue growth, residual at the clip and this                            was sampled with forceps. jar 2                           - Diverticulosis in the left colon.                           - Internal hemorrhoids.                           - The examination was otherwise normal on direct                            and retroflexion views. Recommendation:           - Patient has a contact number available for  emergencies. The signs and symptoms of potential                            delayed complications were discussed with the                            patient. Return to normal activities tomorrow.                             Written discharge instructions were provided to the                            patient.                           - Resume previous diet.                           - Continue present medications. OK to resume your                            blood thinner tomorrow.                           - Await pathology results. Milus Banister, MD 07/21/2020 8:45:30 AM This report has been signed electronically.

## 2020-07-23 ENCOUNTER — Telehealth: Payer: Self-pay

## 2020-07-23 NOTE — Telephone Encounter (Signed)
Left message on follow up call. 

## 2020-07-23 NOTE — Telephone Encounter (Signed)
Patient returned the call state she is doing wonderful no questions at this time.

## 2020-09-23 ENCOUNTER — Ambulatory Visit: Payer: Medicaid Other | Admitting: Podiatry

## 2020-09-30 ENCOUNTER — Ambulatory Visit: Payer: Medicaid Other | Admitting: Podiatry

## 2020-10-13 ENCOUNTER — Ambulatory Visit (INDEPENDENT_AMBULATORY_CARE_PROVIDER_SITE_OTHER): Payer: Medicaid Other | Admitting: Podiatry

## 2020-10-13 ENCOUNTER — Other Ambulatory Visit: Payer: Self-pay

## 2020-10-13 DIAGNOSIS — B351 Tinea unguium: Secondary | ICD-10-CM

## 2020-10-13 DIAGNOSIS — M79675 Pain in left toe(s): Secondary | ICD-10-CM

## 2020-10-13 DIAGNOSIS — E1142 Type 2 diabetes mellitus with diabetic polyneuropathy: Secondary | ICD-10-CM

## 2020-10-13 DIAGNOSIS — Z7901 Long term (current) use of anticoagulants: Secondary | ICD-10-CM

## 2020-10-13 DIAGNOSIS — M79674 Pain in right toe(s): Secondary | ICD-10-CM

## 2020-10-13 NOTE — Patient Instructions (Signed)
(  [id number]   [employer]

## 2020-10-14 ENCOUNTER — Encounter: Payer: Self-pay | Admitting: Podiatry

## 2020-10-14 ENCOUNTER — Telehealth: Payer: Self-pay | Admitting: Podiatry

## 2020-10-14 NOTE — Telephone Encounter (Signed)
Patient called and stated that he need the prescription for his diabetic to the office Dr. Sherryle Lis referred him to in [place]. Pt stated that he wears a size 12 in shoes and that he would black as well.

## 2020-10-14 NOTE — Progress Notes (Signed)
  Subjective:  Patient ID: Gary Lawrence, male    DOB: 01-08-1961,  MRN: [id number]  Chief Complaint  Patient presents with   Nail Problem    Routine foot care    60 y.o. male returns with the above complaint. History confirmed with patient.  He has thickened discolored and uncomfortable toenails.  Debridement last time was helpful.  Reports no new issues.  Says his blood sugar has been good  Objective:  Physical Exam: warm, good capillary refill, no trophic changes or ulcerative lesions and normal DP and PT pulses.  Decreased protective sensation at tips of toes.  Onychomycosis bilaterally 1, 2, 5 and 3 on the right side  Assessment:   1. Pain due to onychomycosis of toenails of both feet   2. Type 2 diabetes mellitus with diabetic polyneuropathy, without long-term current use of insulin ([name])   3. Long term current use of anticoagulant      Plan:  Patient was evaluated and treated and all questions answered.  Discussed the etiology and treatment options for the condition in detail with the patient. Educated patient on the topical and oral treatment options for mycotic nails. Recommended debridement of the nails today. Sharp and mechanical debridement performed of all painful and mycotic nails today. Nails debrided in length and thickness using a nail nipper and a mechanical burr to level of comfort. Discussed treatment options including appropriate shoe gear. Follow up as needed for painful nails.  Patient educated on diabetes. Discussed proper diabetic foot care and discussed risks and complications of disease. Educated patient in depth on reasons to return to the office immediately should he/she discover anything concerning or new on the feet. All questions answered. Discussed proper shoes as well.    No follow-ups on file.

## 2020-11-12 ENCOUNTER — Encounter: Payer: Self-pay | Admitting: Pulmonary Disease

## 2020-12-24 ENCOUNTER — Encounter: Payer: Self-pay | Admitting: Pulmonary Disease

## 2020-12-24 ENCOUNTER — Ambulatory Visit (INDEPENDENT_AMBULATORY_CARE_PROVIDER_SITE_OTHER): Payer: Medicaid Other | Admitting: Pulmonary Disease

## 2020-12-24 ENCOUNTER — Other Ambulatory Visit: Payer: Self-pay

## 2020-12-24 VITALS — BP 120/70 | HR 81 | Temp 98.2°F | Ht 79.0 in | Wt 237.4 lb

## 2020-12-24 DIAGNOSIS — G4730 Sleep apnea, unspecified: Secondary | ICD-10-CM

## 2020-12-24 NOTE — Progress Notes (Signed)
Gary Lawrence    QK:[id number]    Jun 28, 1960  Primary Care Physician:Brown, Aurora Mask, FNP  Referring Physician: Mardi Mainland, [place],  [place] [id number]  Chief complaint:   History of obstructive sleep apnea with a dysfunctional machine  HPI:  Diagnosed with severe obstructive sleep apnea with AHI of 38 Current machine is not working well  Diagnosed about 4 to 5 years ago Since then, has lost a lot of weight  Treated for cancer-history of oropharyngeal cancer  History of hypertension, diabetes, hypercholesterolemia  Usually goes to bed between 8 and 11 PM Falls asleep easily 3-5 awakenings Final wake up time about 4 AM  He does have dryness of his mouth in the morning no headaches Memory is fair  Outpatient Encounter Medications as of 12/24/2020  Medication Sig   ACCU-CHEK GUIDE test strip USE 1 STRIP ONCE DAILY FOR 90 DAYS   acetaminophen (TYLENOL) 325 MG suppository Place 325 mg rectally every 4 (four) hours as needed.   acetaminophen (TYLENOL) 650 MG CR tablet Take 650 mg by mouth every 8 (eight) hours as needed for pain.   amLODipine (NORVASC) 10 MG tablet Take 10 mg by mouth daily.   brimonidine (ALPHAGAN) 0.15 % ophthalmic solution SMARTSIG:In Eye(s)   chlorthalidone (HYGROTON) 25 MG tablet Take 25 mg by mouth daily.   Cholecalciferol (VITAMIN D3) 25 MCG (1000 UT) CAPS Take 1 capsule by mouth daily.   enalapril (VASOTEC) 10 MG tablet Take by mouth.   fenofibrate 54 MG tablet Take 54 mg by mouth daily.   ferrous gluconate (FERGON) 324 MG tablet Take 324 mg by mouth 2 (two) times daily.   ferrous sulfate 324 MG TBEC Take 324 mg by mouth.   fluticasone (FLONASE) 50 MCG/ACT nasal spray Place 1 spray into both nostrils daily.   glipiZIDE (GLUCOTROL XL) 2.5 MG 24 hr tablet Take 2.5 mg by mouth daily.   ketoconazole (NIZORAL) 2 % cream Apply 1 application topically daily.   metFORMIN (GLUCOPHAGE) 1000 MG tablet Take 1,000 mg by  mouth 2 (two) times daily.   metoprolol tartrate (LOPRESSOR) 100 MG tablet Take 100 mg by mouth 2 (two) times daily.   niacin 500 MG tablet Take 500 mg by mouth at bedtime.   pantoprazole (PROTONIX) 40 MG tablet Take 40 mg by mouth daily.   rivaroxaban (XARELTO) 10 MG TABS tablet Take 10 mg by mouth daily.   timolol (BETIMOL) 0.5 % ophthalmic solution 1 drop 2 (two) times daily.   Tolnaftate 1 % AERO Spray inside shoes weekly   No facility-administered encounter medications on file as of 12/24/2020.    Allergies as of 12/24/2020 - Review Complete 10/14/2020  Allergen Reaction Noted   Lac bovis  01/24/2013    Past Medical History:  Diagnosis Date   A-fib ([place])    Cancer ([place])    Thyroid disease     Past Surgical History:  Procedure Laterality Date   CARDIAC SURGERY      No family history on file.  Social History   Socioeconomic History   Marital status: Single    Spouse name: Not on file   Number of children: Not on file   Years of education: Not on file   Highest education level: Not on file  Occupational History   Not on file  Tobacco Use   Smoking status: Never   Smokeless tobacco: Never  Substance and Sexual Activity   Alcohol use: Never  Drug use: Never   Sexual activity: Not on file  Other Topics Concern   Not on file  Social History Narrative   ** Merged History Encounter **       Social Determinants of Health   Financial Resource Strain: Not on file  Food Insecurity: Not on file  Transportation Needs: Not on file  Physical Activity: Not on file  Stress: Not on file  Social Connections: Not on file  Intimate Partner Violence: Not on file    Review of Systems  Constitutional:  Positive for fatigue.  Respiratory:  Positive for apnea. Negative for shortness of breath.   Psychiatric/Behavioral:  Positive for sleep disturbance.    Vitals:   12/24/20 1629  BP: 120/70  Pulse: 81  Temp: 98.2 F (36.8 C)  SpO2: 100%     Physical  Exam Constitutional:      Appearance: Normal appearance.  HENT:     Nose: No congestion.     Mouth/Throat:     Mouth: Mucous membranes are moist.     Comments: Mallampati 2 Eyes:     General:        Right eye: No discharge.        Left eye: No discharge.     Pupils: Pupils are equal, round, and reactive to light.  Cardiovascular:     Rate and Rhythm: Normal rate and regular rhythm.     Heart sounds: No murmur heard.   No friction rub.  Pulmonary:     Effort: No respiratory distress.     Breath sounds: No stridor. No wheezing or rhonchi.  Musculoskeletal:     Cervical back: No rigidity or tenderness.  Neurological:     Mental Status: He is alert.  Psychiatric:        Mood and Affect: Mood normal.   Epworth Sleepiness Scale of 24 Data Reviewed: Referral initial records reviewed    Assessment:  History of severe obstructive sleep apnea on CPAP therapy Has lost over 60 pounds since his study  Still does have some daytime symptoms suggesting presence of obstructive sleep apnea with significant daytime sleepiness  Excessive daytime sleepiness with an ESS of 24  Pathophysiology of sleep disordered breathing reviewed Treatment options discussed  May have less severe disease with a significant weight loss  History of diabetes History of hypertension  He does not currently have a DME company  Plan/Recommendations: Schedule patient for home sleep study  Prescription for CPAP therapy once study obtained  Tentative follow-up in 3 to 4 months       Sherrilyn Rist MD [facility] 12/24/2020, 4:49 PM  CC: Mardi Mainland,*

## 2020-12-24 NOTE — Patient Instructions (Signed)
We will schedule you for home sleep study  Update you with results as soon as possible  Treatment options has been discussed  Tentative follow-up in 3 to 4 monthsSleep Apnea Sleep apnea affects breathing during sleep. It causes breathing to stop for 10 seconds or more, or to become shallow. People with sleep apnea usually snore loudly. It can also increase the risk of: Heart attack. Stroke. Being very overweight (obese). Diabetes. Heart failure. Irregular heartbeat. High blood pressure. The goal of treatment is to help you breathe normally again. What are the causes? The most common cause of this condition is a collapsed or blocked airway. There are three kinds of sleep apnea: Obstructive sleep apnea. This is caused by a blocked or collapsed airway. Central sleep apnea. This happens when the brain does not send the right signals to the muscles that control breathing. Mixed sleep apnea. This is a combination of obstructive and central sleep apnea. What increases the risk? Being overweight. Smoking. Having a small airway. Being older. Being male. Drinking alcohol. Taking medicines to calm yourself (sedatives or tranquilizers). Having family members with the condition. Having a tongue or tonsils that are larger than normal. What are the signs or symptoms? Trouble staying asleep. Loud snoring. Headaches in the morning. Waking up gasping. Dry mouth or sore throat in the morning. Being sleepy or tired during the day. If you are sleepy or tired during the day, you may also: Not be able to focus your mind (concentrate). Forget things. Get angry a lot and have mood swings. Feel sad (depressed). Have changes in your personality. Have less interest in sex, if you are male. Be unable to have an erection, if you are male. How is this treated?  Sleeping on your side. Using a medicine to get rid of mucus in your nose (decongestant). Avoiding the use of alcohol, medicines to  help you relax, or certain pain medicines (narcotics). Losing weight, if needed. Changing your diet. Quitting smoking. Using a machine to open your airway while you sleep, such as: An oral appliance. This is a mouthpiece that shifts your lower jaw forward. A CPAP device. This device blows air through a mask when you breathe out (exhale). An EPAP device. This has valves that you put in each nostril. A BPAP device. This device blows air through a mask when you breathe in (inhale) and breathe out. Having surgery if other treatments do not work. Follow these instructions at home: Lifestyle Make changes that your doctor recommends. Eat a healthy diet. Lose weight if needed. Avoid alcohol, medicines to help you relax, and some pain medicines. Do not smoke or use any products that contain nicotine or tobacco. If you need help quitting, ask your doctor. General instructions Take over-the-counter and prescription medicines only as told by your doctor. If you were given a machine to use while you sleep, use it only as told by your doctor. If you are having surgery, make sure to tell your doctor you have sleep apnea. You may need to bring your device with you. Keep all follow-up visits. Contact a doctor if: The machine that you were given to use during sleep bothers you or does not seem to be working. You do not get better. You get worse. Get help right away if: Your chest hurts. You have trouble breathing in enough air. You have an uncomfortable feeling in your back, arms, or stomach. You have trouble talking. One side of your body feels weak. A part of your face  is hanging down. These symptoms may be an emergency. Get help right away. Call your local emergency services (911 in the [place] Do not wait to see if the symptoms will go away. Do not drive yourself to the hospital. Summary This condition affects breathing during sleep. The most common cause is a collapsed or blocked airway. The  goal of treatment is to help you breathe normally while you sleep. This information is not intended to replace advice given to you by your health care provider. Make sure you discuss any questions you have with your health care provider. Document Revised: 03/21/2020 Document Reviewed: 03/21/2020 Elsevier Patient Education  2022 [employer].

## 2020-12-31 ENCOUNTER — Other Ambulatory Visit: Payer: Self-pay | Admitting: Podiatry

## 2021-01-13 ENCOUNTER — Ambulatory Visit: Payer: Medicaid Other | Admitting: Podiatry

## 2021-01-21 ENCOUNTER — Ambulatory Visit: Payer: Medicaid Other | Admitting: Podiatry

## 2021-01-21 ENCOUNTER — Other Ambulatory Visit: Payer: Self-pay

## 2021-01-21 DIAGNOSIS — B351 Tinea unguium: Secondary | ICD-10-CM | POA: Diagnosis not present

## 2021-01-21 DIAGNOSIS — M79675 Pain in left toe(s): Secondary | ICD-10-CM

## 2021-01-21 DIAGNOSIS — E1142 Type 2 diabetes mellitus with diabetic polyneuropathy: Secondary | ICD-10-CM

## 2021-01-21 DIAGNOSIS — M79674 Pain in right toe(s): Secondary | ICD-10-CM | POA: Diagnosis not present

## 2021-01-21 NOTE — Progress Notes (Signed)
  Subjective:  Patient ID: Gary Lawrence, male    DOB: Aug 13, 1960,  MRN: [id number]  Chief Complaint  Patient presents with   Nail Problem    Thick painful toenails, 3 month follow up    60 y.o. male returns with the above complaint. History confirmed with patient.  He has thickened discolored and uncomfortable toenails.  Debridement last time was helpful.  Reports no new issues.  Says his blood sugar has been good  Objective:  Physical Exam: warm, good capillary refill, no trophic changes or ulcerative lesions and normal DP and PT pulses.  Decreased protective sensation at tips of toes.  Onychomycosis bilaterally 1, 2, 5 and 3 on the right side  Assessment:   No diagnosis found.    Plan:  Patient was evaluated and treated and all questions answered.  Discussed the etiology and treatment options for the condition in detail with the patient. Educated patient on the topical and oral treatment options for mycotic nails. Recommended debridement of the nails today. Sharp and mechanical debridement performed of all painful and mycotic nails today. Nails debrided in length and thickness using a nail nipper and a mechanical burr to level of comfort. Discussed treatment options including appropriate shoe gear. Follow up as needed for painful nails.  Patient educated on diabetes. Discussed proper diabetic foot care and discussed risks and complications of disease. Educated patient in depth on reasons to return to the office immediately should he/she discover anything concerning or new on the feet. All questions answered. Discussed proper shoes as well. Rx for diabetic shoes faxed for him to [place]    No follow-ups on file.

## 2021-01-22 ENCOUNTER — Telehealth: Payer: Self-pay | Admitting: Podiatry

## 2021-01-22 NOTE — Telephone Encounter (Signed)
Received a call from [facility] they did get the rx for pts diabetic shoes yesterday but they do not carry any type of work shoe/boot.They have a boot or tennis shoe but they are not non slip.. Pt has never been seen there.

## 2021-01-30 ENCOUNTER — Telehealth: Payer: Self-pay | Admitting: Podiatry

## 2021-01-30 NOTE — Telephone Encounter (Signed)
Called pt to discuss the diabetic shoes and inserts because he requested non slip work shoes and we sent the rx to [facility] but they do not carry any diabetic shoes like that.  I have contacted [facility] and they do carry them. I confirmed with pt that he was willing to go to [place] to get the shoes and inserts and he said yes. I faxed the orders and mailed a copy of the address to the pt.

## 2021-03-03 ENCOUNTER — Other Ambulatory Visit: Payer: Self-pay

## 2021-03-03 ENCOUNTER — Ambulatory Visit: Payer: Medicaid Other

## 2021-03-03 DIAGNOSIS — G4733 Obstructive sleep apnea (adult) (pediatric): Secondary | ICD-10-CM

## 2021-03-03 DIAGNOSIS — G4730 Sleep apnea, unspecified: Secondary | ICD-10-CM

## 2021-03-10 ENCOUNTER — Telehealth: Payer: Self-pay | Admitting: Pulmonary Disease

## 2021-03-10 DIAGNOSIS — G4733 Obstructive sleep apnea (adult) (pediatric): Secondary | ICD-10-CM

## 2021-03-10 DIAGNOSIS — G4730 Sleep apnea, unspecified: Secondary | ICD-10-CM

## 2021-03-10 NOTE — Telephone Encounter (Signed)
Call patient  Sleep study result  Date of study: 03/03/2021  Impression: Severe obstructive sleep apnea Mild oxygen desaturations  Recommendation: DME referral  Recommend CPAP therapy for severe obstructive sleep apnea  Auto titrating CPAP with pressure settings of 5-20 will be appropriate  Encourage weight loss measures  Follow-up in the office 4 to 6 weeks following initiation of treatment

## 2021-03-11 NOTE — Telephone Encounter (Signed)
I have called the pt and he is aware of these results per AO.  I have sent order for the cpap to be set up.  Pt is aware.

## 2021-04-23 ENCOUNTER — Ambulatory Visit: Payer: Medicaid Other | Admitting: Podiatry

## 2021-04-23 ENCOUNTER — Other Ambulatory Visit: Payer: Self-pay

## 2021-04-23 DIAGNOSIS — B351 Tinea unguium: Secondary | ICD-10-CM

## 2021-04-23 DIAGNOSIS — E1142 Type 2 diabetes mellitus with diabetic polyneuropathy: Secondary | ICD-10-CM

## 2021-04-23 DIAGNOSIS — M79675 Pain in left toe(s): Secondary | ICD-10-CM

## 2021-04-23 DIAGNOSIS — M79674 Pain in right toe(s): Secondary | ICD-10-CM | POA: Diagnosis not present

## 2021-04-27 ENCOUNTER — Encounter: Payer: Self-pay | Admitting: Podiatry

## 2021-04-27 NOTE — Progress Notes (Signed)
°  Subjective:  Patient ID: Gary Lawrence, male    DOB: [birth year],  MRN: [id number]  Chief Complaint  Patient presents with   Nail Problem    Thick painful toenails, 3 month follow up    61 y.o. male returns with the above complaint. History confirmed with patient.  He has thickened discolored and uncomfortable toenails.  Debridement last time was helpful.  Doing well he has no new pedal issues.  He has had good blood control since last visit.  The shoes been very helpful for him that he got  Objective:  Physical Exam: warm, good capillary refill, no trophic changes or ulcerative lesions and normal DP and PT pulses.  Decreased protective sensation at tips of toes.  Onychomycosis bilaterally 1, 2, 5 and 3 on the right side  Assessment:   1. Pain due to onychomycosis of toenails of both feet   2. Type 2 diabetes mellitus with diabetic polyneuropathy, without long-term current use of insulin ([place])       Plan:  Patient was evaluated and treated and all questions answered.  Discussed the etiology and treatment options for the condition in detail with the patient. Educated patient on the topical and oral treatment options for mycotic nails. Recommended debridement of the nails today. Sharp and mechanical debridement performed of all painful and mycotic nails today. Nails debrided in length and thickness using a nail nipper and a mechanical burr to level of comfort. Discussed treatment options including appropriate shoe gear. Follow up as needed for painful nails.  Patient educated on diabetes. Discussed proper diabetic foot care and discussed risks and complications of disease. Educated patient in depth on reasons to return to the office immediately should he/she discover anything concerning or new on the feet. All questions answered. Discussed proper shoes as well.  Continue wearing diabetic shoes   Return in about 3 months (around 07/22/2021) for at risk diabetic foot care.

## 2021-07-21 ENCOUNTER — Other Ambulatory Visit: Payer: Self-pay

## 2021-07-21 ENCOUNTER — Ambulatory Visit: Payer: Medicaid Other | Admitting: Podiatry

## 2021-07-21 DIAGNOSIS — Z7901 Long term (current) use of anticoagulants: Secondary | ICD-10-CM

## 2021-07-21 DIAGNOSIS — M79675 Pain in left toe(s): Secondary | ICD-10-CM | POA: Diagnosis not present

## 2021-07-21 DIAGNOSIS — E1142 Type 2 diabetes mellitus with diabetic polyneuropathy: Secondary | ICD-10-CM

## 2021-07-21 DIAGNOSIS — B351 Tinea unguium: Secondary | ICD-10-CM

## 2021-07-21 DIAGNOSIS — M79674 Pain in right toe(s): Secondary | ICD-10-CM

## 2021-07-23 ENCOUNTER — Ambulatory Visit: Payer: Medicaid Other | Admitting: Podiatry

## 2021-07-24 ENCOUNTER — Encounter: Payer: Self-pay | Admitting: Podiatry

## 2021-07-24 NOTE — Progress Notes (Signed)
?  Subjective:  ?Patient ID: Gary Lawrence, male    DOB: [birth year],  MRN: [id number] ? ?Chief Complaint  ?Patient presents with  ? Nail Problem  ?  Patient reports elongated, thick, painful toenails that are difficult to trim. No other concerns voiced today.   ? ? ?61 y.o. male returns with the above complaint. History confirmed with patient.  He has thickened discolored and uncomfortable toenails.  Debridement last time was helpful.  Doing well he has no new pedal issues.   ? ?Objective:  ?Physical Exam: ?warm, good capillary refill, no trophic changes or ulcerative lesions and normal DP and PT pulses.  Decreased protective sensation at tips of toes.  Onychomycosis bilaterally 1, 2, 5 and 3 on the right side  ?Assessment:  ? ?1. Pain due to onychomycosis of toenails of both feet   ?2. Type 2 diabetes mellitus with diabetic polyneuropathy, without long-term current use of insulin ([place])   ?3. Long term current use of anticoagulant   ? ? ? ? ?Plan:  ?Patient was evaluated and treated and all questions answered. ? ?Discussed the etiology and treatment options for the condition in detail with the patient. Educated patient on the topical and oral treatment options for mycotic nails. Recommended debridement of the nails today. Sharp and mechanical debridement performed of all painful and mycotic nails today. Nails debrided in length and thickness using a nail nipper and a mechanical burr to level of comfort. Discussed treatment options including appropriate shoe gear. Follow up as needed for painful nails. ? ?Patient educated on diabetes. Discussed proper diabetic foot care and discussed risks and complications of disease. Educated patient in depth on reasons to return to the office immediately should he/she discover anything concerning or new on the feet. All questions answered. Discussed proper shoes as well.  Continue wearing diabetic shoes ? ? ?Return in about 3 months (around 10/21/2021) for at risk foot care.  ? ?

## 2021-10-22 ENCOUNTER — Ambulatory Visit: Payer: Medicaid Other | Admitting: Podiatry

## 2021-10-22 DIAGNOSIS — B351 Tinea unguium: Secondary | ICD-10-CM | POA: Diagnosis not present

## 2021-10-22 DIAGNOSIS — M79675 Pain in left toe(s): Secondary | ICD-10-CM | POA: Diagnosis not present

## 2021-10-22 DIAGNOSIS — M79674 Pain in right toe(s): Secondary | ICD-10-CM

## 2021-10-22 DIAGNOSIS — E1142 Type 2 diabetes mellitus with diabetic polyneuropathy: Secondary | ICD-10-CM

## 2021-10-25 ENCOUNTER — Encounter: Payer: Self-pay | Admitting: Podiatry

## 2021-10-25 NOTE — Progress Notes (Signed)
  Subjective:  Patient ID: Gary Lawrence, male    DOB: [birth year],  MRN: [id number]  Chief Complaint  Patient presents with   Nail Problem    Thick painful toenails, 3 month follow up     61 y.o. male returns with the above complaint. History confirmed with patient.  He has thickened discolored and uncomfortable toenails.  Debridement last time was helpful.  Doing well he has no new pedal issues.  Says his blood sugar has been well controlled  Objective:  Physical Exam: warm, good capillary refill, no trophic changes or ulcerative lesions and normal DP and PT pulses.  Decreased protective sensation at tips of toes.  Onychomycosis bilaterally 1, 2, 5 and 3 on the right side  Assessment:   1. Pain due to onychomycosis of toenails of both feet   2. Type 2 diabetes mellitus with diabetic polyneuropathy, without long-term current use of insulin ([name])       Plan:  Patient was evaluated and treated and all questions answered.  Discussed the etiology and treatment options for the condition in detail with the patient. Educated patient on the topical and oral treatment options for mycotic nails. Recommended debridement of the nails today. Sharp and mechanical debridement performed of all painful and mycotic nails today. Nails debrided in length and thickness using a nail nipper and a mechanical burr to level of comfort. Discussed treatment options including appropriate shoe gear. Follow up as needed for painful nails.  Patient educated on diabetes. Discussed proper diabetic foot care and discussed risks and complications of disease. Educated patient in depth on reasons to return to the office immediately should he/she discover anything concerning or new on the feet. All questions answered. Discussed proper shoes as well.  Continue wearing diabetic shoes   Return in about 3 months (around 01/22/2022) for at risk diabetic foot care.

## 2022-01-28 ENCOUNTER — Ambulatory Visit: Payer: Medicaid Other | Admitting: Podiatry

## 2022-01-29 ENCOUNTER — Emergency Department (HOSPITAL_COMMUNITY): Payer: Medicaid Other

## 2022-01-29 ENCOUNTER — Other Ambulatory Visit: Payer: Self-pay

## 2022-01-29 ENCOUNTER — Encounter (HOSPITAL_COMMUNITY): Payer: Self-pay | Admitting: Emergency Medicine

## 2022-01-29 ENCOUNTER — Emergency Department (HOSPITAL_COMMUNITY)
Admission: EM | Admit: 2022-01-29 | Discharge: 2022-01-29 | Disposition: A | Discharge status: Home / Self Care | Payer: Medicaid Other | Attending: Emergency Medicine | Admitting: Emergency Medicine

## 2022-01-29 DIAGNOSIS — Z7901 Long term (current) use of anticoagulants: Secondary | ICD-10-CM | POA: Diagnosis not present

## 2022-01-29 DIAGNOSIS — R791 Abnormal coagulation profile: Secondary | ICD-10-CM | POA: Diagnosis not present

## 2022-01-29 DIAGNOSIS — Z23 Encounter for immunization: Secondary | ICD-10-CM | POA: Diagnosis not present

## 2022-01-29 DIAGNOSIS — Z79899 Other long term (current) drug therapy: Secondary | ICD-10-CM | POA: Diagnosis not present

## 2022-01-29 DIAGNOSIS — S025XXA Fracture of tooth (traumatic), initial encounter for closed fracture: Secondary | ICD-10-CM | POA: Insufficient documentation

## 2022-01-29 DIAGNOSIS — E119 Type 2 diabetes mellitus without complications: Secondary | ICD-10-CM | POA: Diagnosis not present

## 2022-01-29 DIAGNOSIS — S01112A Laceration without foreign body of left eyelid and periocular area, initial encounter: Secondary | ICD-10-CM | POA: Diagnosis not present

## 2022-01-29 DIAGNOSIS — K08539 Fractured dental restorative material, unspecified: Secondary | ICD-10-CM

## 2022-01-29 DIAGNOSIS — S0993XA Unspecified injury of face, initial encounter: Secondary | ICD-10-CM | POA: Diagnosis present

## 2022-01-29 DIAGNOSIS — M542 Cervicalgia: Secondary | ICD-10-CM | POA: Insufficient documentation

## 2022-01-29 DIAGNOSIS — Z85818 Personal history of malignant neoplasm of other sites of lip, oral cavity, and pharynx: Secondary | ICD-10-CM | POA: Insufficient documentation

## 2022-01-29 DIAGNOSIS — M25562 Pain in left knee: Secondary | ICD-10-CM | POA: Diagnosis not present

## 2022-01-29 DIAGNOSIS — I10 Essential (primary) hypertension: Secondary | ICD-10-CM | POA: Insufficient documentation

## 2022-01-29 DIAGNOSIS — Z7984 Long term (current) use of oral hypoglycemic drugs: Secondary | ICD-10-CM | POA: Insufficient documentation

## 2022-01-29 DIAGNOSIS — N179 Acute kidney failure, unspecified: Secondary | ICD-10-CM | POA: Insufficient documentation

## 2022-01-29 DIAGNOSIS — Y92410 Unspecified street and highway as the place of occurrence of the external cause: Secondary | ICD-10-CM | POA: Diagnosis not present

## 2022-01-29 DIAGNOSIS — S0083XA Contusion of other part of head, initial encounter: Secondary | ICD-10-CM

## 2022-01-29 LAB — URINALYSIS, ROUTINE W REFLEX MICROSCOPIC
Bilirubin Urine: NEGATIVE
Glucose, UA: NEGATIVE mg/dL
Hgb urine dipstick: NEGATIVE
Ketones, ur: NEGATIVE mg/dL
Leukocytes,Ua: NEGATIVE
Nitrite: NEGATIVE
Protein, ur: NEGATIVE mg/dL
Specific Gravity, Urine: 1.01 (ref 1.005–1.030)
pH: 6 (ref 5.0–8.0)

## 2022-01-29 LAB — SAMPLE TO BLOOD BANK

## 2022-01-29 LAB — I-STAT CHEM 8, ED
BUN: 28 mg/dL — ABNORMAL HIGH (ref 8–23)
Calcium, Ion: 1.08 mmol/L — ABNORMAL LOW (ref 1.15–1.40)
Chloride: 106 mmol/L (ref 98–111)
Creatinine, Ser: 1.9 mg/dL — ABNORMAL HIGH (ref 0.61–1.24)
Glucose, Bld: 98 mg/dL (ref 70–99)
HCT: 35 % — ABNORMAL LOW (ref 39.0–52.0)
Hemoglobin: 11.9 g/dL — ABNORMAL LOW (ref 13.0–17.0)
Potassium: 3.9 mmol/L (ref 3.5–5.1)
Sodium: 138 mmol/L (ref 135–145)
TCO2: 21 mmol/L — ABNORMAL LOW (ref 22–32)

## 2022-01-29 LAB — COMPREHENSIVE METABOLIC PANEL
ALT: 21 U/L (ref 0–44)
AST: 26 U/L (ref 15–41)
Albumin: 3.7 g/dL (ref 3.5–5.0)
Alkaline Phosphatase: 42 U/L (ref 38–126)
Anion gap: 11 (ref 5–15)
BUN: 25 mg/dL — ABNORMAL HIGH (ref 8–23)
CO2: 25 mmol/L (ref 22–32)
Calcium: 9.9 mg/dL (ref 8.9–10.3)
Chloride: 103 mmol/L (ref 98–111)
Creatinine, Ser: 1.78 mg/dL — ABNORMAL HIGH (ref 0.61–1.24)
GFR, Estimated: 42 mL/min — ABNORMAL LOW (ref >60)
Glucose, Bld: 114 mg/dL — ABNORMAL HIGH (ref 70–99)
Potassium: 4 mmol/L (ref 3.5–5.1)
Sodium: 139 mmol/L (ref 135–145)
Total Bilirubin: 0.4 mg/dL (ref 0.3–1.2)
Total Protein: 6.8 g/dL (ref 6.5–8.1)

## 2022-01-29 LAB — CBC
HCT: 41.5 % (ref 39.0–52.0)
Hemoglobin: 13.6 g/dL (ref 13.0–17.0)
MCH: 27.2 pg (ref 26.0–34.0)
MCHC: 32.8 g/dL (ref 30.0–36.0)
MCV: 83 fL (ref 80.0–100.0)
Platelets: 314 10*3/uL (ref 150–400)
RBC: 5 MIL/uL (ref 4.22–5.81)
RDW: 13.4 % (ref 11.5–15.5)
WBC: 8.1 10*3/uL (ref 4.0–10.5)
nRBC: 0 % (ref 0.0–0.2)

## 2022-01-29 LAB — ETHANOL: Alcohol, Ethyl (B): <10 mg/dL (ref <10)

## 2022-01-29 LAB — PROTIME-INR
INR: 2.9 — ABNORMAL HIGH (ref 0.8–1.2)
Prothrombin Time: 30.1 seconds — ABNORMAL HIGH (ref 11.4–15.2)

## 2022-01-29 LAB — LACTIC ACID, PLASMA: Lactic Acid, Venous: 2 mmol/L (ref 0.5–1.9)

## 2022-01-29 LAB — HEMOGLOBIN AND HEMATOCRIT, BLOOD
HCT: 37.3 % — ABNORMAL LOW (ref 39.0–52.0)
Hemoglobin: 12 g/dL — ABNORMAL LOW (ref 13.0–17.0)

## 2022-01-29 MED ORDER — BACITRACIN ZINC 500 UNIT/GM EX OINT: 1.0000 | TOPICAL_OINTMENT | Freq: Two times a day (BID) | CUTANEOUS | 0 refills | Status: DC

## 2022-01-29 MED ORDER — LIDOCAINE HCL (PF) 1 % IJ SOLN
5.0000 mL | Freq: Once | INTRAMUSCULAR | Status: AC
  Administered 2022-01-29: 5 mL
  Filled 2022-01-29: qty 5

## 2022-01-29 MED ORDER — LACTATED RINGERS IV BOLUS
1000.0000 mL | Freq: Once | INTRAVENOUS | Status: AC
  Administered 2022-01-29: 1000 mL via INTRAVENOUS

## 2022-01-29 MED ORDER — METOPROLOL TARTRATE 25 MG PO TABS
100.0000 mg | ORAL_TABLET | Freq: Once | ORAL | Status: AC
  Administered 2022-01-29: 100 mg via ORAL
  Filled 2022-01-29: qty 4

## 2022-01-29 MED ORDER — TETANUS-DIPHTH-ACELL PERTUSSIS 5-2.5-18.5 LF-MCG/0.5 IM SUSY
0.5000 mL | PREFILLED_SYRINGE | Freq: Once | INTRAMUSCULAR | Status: AC
  Administered 2022-01-29: 0.5 mL via INTRAMUSCULAR
  Filled 2022-01-29: qty 0.5
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
## 2022-01-29 NOTE — ED Notes (Signed)
Pt transported to CT via TRN
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
## 2022-02-01 ENCOUNTER — Ambulatory Visit: Payer: Medicaid Other | Admitting: Podiatry

## 2022-02-01 DIAGNOSIS — M79675 Pain in left toe(s): Secondary | ICD-10-CM | POA: Diagnosis not present

## 2022-02-01 DIAGNOSIS — B351 Tinea unguium: Secondary | ICD-10-CM | POA: Diagnosis not present

## 2022-02-01 DIAGNOSIS — M79674 Pain in right toe(s): Secondary | ICD-10-CM | POA: Diagnosis not present

## 2022-02-01 DIAGNOSIS — E1142 Type 2 diabetes mellitus with diabetic polyneuropathy: Secondary | ICD-10-CM

## 2022-02-04 NOTE — Progress Notes (Signed)
  Subjective:  Patient ID: Gary Lawrence, male    DOB: 12-11-1960,  MRN: [id number]  Chief Complaint  Patient presents with   Nail Problem    Thick painful toenails, 3 month follow up     61 y.o. male returns with the above complaint. History confirmed with patient.  He has thickened discolored and uncomfortable toenails.  Debridement last time was helpful.  Doing well he has no new pedal issues.  Says his blood sugar has been well controlled  Objective:  Physical Exam: warm, good capillary refill, no trophic changes or ulcerative lesions and normal DP and PT pulses.  Decreased protective sensation at tips of toes.  Onychomycosis bilaterally 1, 2, 5 and 3 on the right side  Assessment:   1. Pain due to onychomycosis of toenails of both feet   2. Type 2 diabetes mellitus with diabetic polyneuropathy, without long-term current use of insulin ([place])        Plan:  Patient was evaluated and treated and all questions answered.  Discussed the etiology and treatment options for the condition in detail with the patient. Educated patient on the topical and oral treatment options for mycotic nails. Recommended debridement of the nails today. Sharp and mechanical debridement performed of all painful and mycotic nails today. Nails debrided in length and thickness using a nail nipper and a mechanical burr to level of comfort. Discussed treatment options including appropriate shoe gear. Follow up as needed for painful nails.  Patient educated on diabetes. Discussed proper diabetic foot care and discussed risks and complications of disease. Educated patient in depth on reasons to return to the office immediately should he/she discover anything concerning or new on the feet. All questions answered. Discussed proper shoes as well.  Continue wearing diabetic shoes   Return in about 3 months (around 05/04/2022) for at risk diabetic foot care.

## 2022-04-28 ENCOUNTER — Encounter: Payer: Self-pay | Admitting: Podiatry

## 2022-05-04 ENCOUNTER — Ambulatory Visit: Payer: Medicaid Other | Admitting: Podiatry

## 2022-05-12 ENCOUNTER — Ambulatory Visit: Payer: Medicaid Other | Admitting: Podiatry

## 2022-05-12 DIAGNOSIS — M79675 Pain in left toe(s): Secondary | ICD-10-CM | POA: Diagnosis not present

## 2022-05-12 DIAGNOSIS — B351 Tinea unguium: Secondary | ICD-10-CM | POA: Diagnosis not present

## 2022-05-12 DIAGNOSIS — M79674 Pain in right toe(s): Secondary | ICD-10-CM | POA: Diagnosis not present

## 2022-05-12 DIAGNOSIS — E1142 Type 2 diabetes mellitus with diabetic polyneuropathy: Secondary | ICD-10-CM | POA: Diagnosis not present

## 2022-05-13 NOTE — Progress Notes (Signed)
  Subjective:  Patient ID: Gary Lawrence, male    DOB: [birth year],  MRN: [id number]  Chief Complaint  Patient presents with   Nail Problem    Thick painful toenails, 3 month follow up    62 y.o. male returns with the above complaint. History confirmed with patient.  He has thickened discolored and uncomfortable toenails.  Debridement last time was helpful.  Doing well he has no new pedal issues.  Says his blood sugar has been well controlled  Objective:  Physical Exam: warm, good capillary refill, no trophic changes or ulcerative lesions and normal DP and PT pulses.  Decreased protective sensation at tips of toes.  Onychomycosis bilaterally 1, 2, 5 and 3 on the right side  Assessment:   1. Pain due to onychomycosis of toenails of both feet   2. Type 2 diabetes mellitus with diabetic polyneuropathy, without long-term current use of insulin ([name])         Plan:  Patient was evaluated and treated and all questions answered.  Discussed the etiology and treatment options for the condition in detail with the patient. Educated patient on the topical and oral treatment options for mycotic nails. Recommended debridement of the nails today. Sharp and mechanical debridement performed of all painful and mycotic nails today. Nails debrided in length and thickness using a nail nipper and a mechanical burr to level of comfort. Discussed treatment options including appropriate shoe gear. Follow up as needed for painful nails.  Patient educated on diabetes. Discussed proper diabetic foot care and discussed risks and complications of disease. Educated patient in depth on reasons to return to the office immediately should he/she discover anything concerning or new on the feet. All questions answered. Discussed proper shoes as well.  Continue wearing diabetic shoes   No follow-ups on file.

## 2022-05-17 ENCOUNTER — Ambulatory Visit (INDEPENDENT_AMBULATORY_CARE_PROVIDER_SITE_OTHER): Payer: Medicaid Other

## 2022-05-17 DIAGNOSIS — E1142 Type 2 diabetes mellitus with diabetic polyneuropathy: Secondary | ICD-10-CM

## 2022-05-17 NOTE — Progress Notes (Signed)
Patient presents to the office today for diabetic shoe and insole measuring.  Patient was measured with brannock device to determine size and width for 1 pair of extra depth shoes and foam casted for 3 pair of insoles.   ABN signed.   Documentation of medical necessity will be sent to patient's treating diabetic doctor to verify and sign.   Patient's diabetic provider: Willey Blade, FNP  sending to Dewayne Shorter, MD  Shoes and insoles will be ordered at that time and patient will be notified for an appointment for fitting when they arrive.   Patient shoe selection-   1st   Shoe choice:   [id number]  2nd  Shoe choice:   [place] size ordered: 61  W
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
## 2022-08-26 ENCOUNTER — Other Ambulatory Visit: Payer: Self-pay

## 2022-08-26 ENCOUNTER — Emergency Department (HOSPITAL_COMMUNITY)
Admission: EM | Admit: 2022-08-26 | Discharge: 2022-08-26 | Disposition: A | Discharge status: Home / Self Care | Payer: Medicaid Other | Attending: Emergency Medicine | Admitting: Emergency Medicine

## 2022-08-26 ENCOUNTER — Emergency Department (HOSPITAL_COMMUNITY): Payer: Medicaid Other

## 2022-08-26 ENCOUNTER — Encounter (HOSPITAL_COMMUNITY): Payer: Self-pay

## 2022-08-26 DIAGNOSIS — S80211A Abrasion, right knee, initial encounter: Secondary | ICD-10-CM | POA: Diagnosis not present

## 2022-08-26 DIAGNOSIS — Z7901 Long term (current) use of anticoagulants: Secondary | ICD-10-CM

## 2022-08-26 DIAGNOSIS — S80212A Abrasion, left knee, initial encounter: Secondary | ICD-10-CM | POA: Diagnosis not present

## 2022-08-26 DIAGNOSIS — M79604 Pain in right leg: Secondary | ICD-10-CM | POA: Diagnosis present

## 2022-08-26 LAB — URINALYSIS, ROUTINE W REFLEX MICROSCOPIC
Bilirubin Urine: NEGATIVE
Glucose, UA: NEGATIVE mg/dL
Hgb urine dipstick: NEGATIVE
Ketones, ur: NEGATIVE mg/dL
Leukocytes,Ua: NEGATIVE
Nitrite: NEGATIVE
Protein, ur: NEGATIVE mg/dL
Specific Gravity, Urine: 1.012 (ref 1.005–1.030)
pH: 5 (ref 5.0–8.0)

## 2022-08-26 LAB — CBC
HCT: 35.9 % — ABNORMAL LOW (ref 39.0–52.0)
Hemoglobin: 11.3 g/dL — ABNORMAL LOW (ref 13.0–17.0)
MCH: 26.8 pg (ref 26.0–34.0)
MCHC: 31.5 g/dL (ref 30.0–36.0)
MCV: 85.1 fL (ref 80.0–100.0)
Platelets: 270 10*3/uL (ref 150–400)
RBC: 4.22 MIL/uL (ref 4.22–5.81)
RDW: 14.4 % (ref 11.5–15.5)
WBC: 5.1 10*3/uL (ref 4.0–10.5)
nRBC: 0 % (ref 0.0–0.2)

## 2022-08-26 LAB — COMPREHENSIVE METABOLIC PANEL
ALT: 19 U/L (ref 0–44)
AST: 26 U/L (ref 15–41)
Albumin: 3.9 g/dL (ref 3.5–5.0)
Alkaline Phosphatase: 45 U/L (ref 38–126)
Anion gap: 7 (ref 5–15)
BUN: 23 mg/dL (ref 8–23)
CO2: 27 mmol/L (ref 22–32)
Calcium: 9.5 mg/dL (ref 8.9–10.3)
Chloride: 105 mmol/L (ref 98–111)
Creatinine, Ser: 1.78 mg/dL — ABNORMAL HIGH (ref 0.61–1.24)
GFR, Estimated: 42 mL/min — ABNORMAL LOW (ref >60)
Glucose, Bld: 143 mg/dL — ABNORMAL HIGH (ref 70–99)
Potassium: 3.6 mmol/L (ref 3.5–5.1)
Sodium: 139 mmol/L (ref 135–145)
Total Bilirubin: 0.6 mg/dL (ref 0.3–1.2)
Total Protein: 6.8 g/dL (ref 6.5–8.1)

## 2022-08-26 LAB — I-STAT CHEM 8, ED
BUN: 27 mg/dL — ABNORMAL HIGH (ref 8–23)
Calcium, Ion: 1.23 mmol/L (ref 1.15–1.40)
Chloride: 106 mmol/L (ref 98–111)
Creatinine, Ser: 2 mg/dL — ABNORMAL HIGH (ref 0.61–1.24)
Glucose, Bld: 161 mg/dL — ABNORMAL HIGH (ref 70–99)
HCT: 36 % — ABNORMAL LOW (ref 39.0–52.0)
Hemoglobin: 12.2 g/dL — ABNORMAL LOW (ref 13.0–17.0)
Potassium: 4 mmol/L (ref 3.5–5.1)
Sodium: 143 mmol/L (ref 135–145)
TCO2: 28 mmol/L (ref 22–32)

## 2022-08-26 LAB — LACTIC ACID, PLASMA: Lactic Acid, Venous: 1.9 mmol/L (ref 0.5–1.9)

## 2022-08-26 MED ORDER — METHOCARBAMOL 750 MG PO TABS: 750.0000 mg | ORAL_TABLET | Freq: Three times a day (TID) | ORAL | 0 refills | Status: DC | PRN

## 2022-08-26 MED ORDER — HYDROCODONE-ACETAMINOPHEN 5-325 MG PO TABS: 1.0000 | ORAL_TABLET | Freq: Four times a day (QID) | ORAL | 0 refills | Status: DC | PRN

## 2022-08-26 NOTE — Discharge Instructions (Signed)
1.  Take extra strength Tylenol for pain control.  You may add Robaxin as a muscle relaxer.  Apply well wrapped cold packs to areas that are sore or swollen.  If this is not adequate for controlling pain, you may take 1-2 Vicodin tablets every 6 hours.  Do not combine the Vicodin with Tylenol because Vicodin already contains Tylenol.  Vicodin may cause constipation.  If you tend to get constipation take an over-the-counter stool softener such as Colace twice a day. 2.  Schedule a recheck with your doctor in 3 to 5 days.  Often you will have significantly more stiffness and achy pain in about 3 to 5 days.
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
## 2022-08-26 NOTE — ED Notes (Signed)
Several attempts were made to start an IV and draw blood. All infiltrated. IV team consult put in.
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
## 2022-11-06 ENCOUNTER — Encounter (HOSPITAL_COMMUNITY): Payer: Self-pay

## 2022-11-06 ENCOUNTER — Ambulatory Visit (HOSPITAL_COMMUNITY)
Admission: EM | Admit: 2022-11-06 | Discharge: 2022-11-06 | Disposition: A | Discharge status: Home / Self Care | Payer: Medicaid Other | Attending: Family Medicine | Admitting: Family Medicine

## 2022-11-06 DIAGNOSIS — L739 Follicular disorder, unspecified: Secondary | ICD-10-CM | POA: Diagnosis not present

## 2022-11-06 MED ORDER — DOXYCYCLINE HYCLATE 100 MG PO CAPS: 100.0000 mg | ORAL_CAPSULE | Freq: Two times a day (BID) | ORAL | 0 refills | Status: DC

## 2022-11-06 NOTE — ED Triage Notes (Signed)
Here for rash on back and arms x 1 month.

## 2022-11-08 NOTE — ED Provider Notes (Signed)
  [facility]   [id number] 11/06/22 Arrival Time: 1125  ASSESSMENT & PLAN:  1. Folliculitis    Will treat empirically for folliculitis. No signs of abscess formation. Meds ordered this encounter  Medications   doxycycline (VIBRAMYCIN) 100 MG capsule    Sig: Take 1 capsule (100 mg total) by mouth 2 (two) times daily.    Dispense:  20 capsule    Refill:  0    Will follow up with PCP or here if worsening or failing to improve as anticipated. Reviewed expectations re: course of current medical issues. Questions answered. Outlined signs and symptoms indicating need for more acute intervention. Patient verbalized understanding. After Visit Summary given.   SUBJECTIVE:  Gary Lawrence is a 62 y.o. male who presents with a skin complaint. Reports area of skin thickening on LEFT anterior thigh; unsure of how long; "initially had a head on it"; is uncomfortable; denies drainage/bleeding. No tx PTA.  OBJECTIVE: Vitals:   11/06/22 1215  BP: 127/80  Pulse: 80  Resp: 18  Temp: 97.8 F (36.6 C)  TempSrc: Oral  SpO2: 96%    General appearance: alert; no distress HEENT: [place]; AT Neck: supple with FROM Lungs: clear to auscultation bilaterally Heart: regular rate and rhythm Extremities: no edema; moves all extremities normally Skin: warm and dry; approx 1x1 cm area of skin thickening on anterior mid thigh; mild overlying erythema; firm to touch; no fluctuance/bleeding/draiange Psychological: alert and cooperative; normal mood and affect  Allergies  Allergen Reactions   Milk (Cow)     Other reaction(s): VOMITING    Past Medical History:  Diagnosis Date   A-fib (HCC)    Cancer (HCC)    Thyroid disease    Social History   Socioeconomic History   Marital status: Married    Spouse name: Not on file   Number of children: Not on file   Years of education: Not on file   Highest education level: Not on file  Occupational History   Not on file  Tobacco Use   Smoking  status: Never   Smokeless tobacco: Never  Substance and Sexual Activity   Alcohol use: Never   Drug use: Never   Sexual activity: Not on file  Other Topics Concern   Not on file  Social History Narrative   ** Merged History Encounter **       Social Determinants of Health   Financial Resource Strain: Not on File (08/13/2021)   Received from [facility], [employer]    Financial Resource Strain: 0  Food Insecurity: Not on File (08/13/2021)   Received from [place], [facility] Insecurity    Food: 0  Transportation Needs: Not on File (08/13/2021)   Received from [facility], [employer] Needs    Transportation: 0  Physical Activity: Not on File (08/13/2021)   Received from [place], [place]   Physical Activity    Physical Activity: 0  Stress: Not on File (08/13/2021)   Received from [facility], [place]   Stress    Stress: 0  Social Connections: Not on File (08/13/2021)   Received from [place], [place]   Social Connections    Social Connections and Isolation: 0  Intimate Partner Violence: Not on file   History reviewed. No pertinent family history. Past Surgical History:  Procedure Laterality Date   CARDIAC SURGERY        Mardella Layman, MD 11/08/22 [phone]

## 2023-03-31 DIAGNOSIS — R32 Unspecified urinary incontinence: Secondary | ICD-10-CM | POA: Insufficient documentation

## 2023-05-23 ENCOUNTER — Encounter: Payer: Self-pay | Admitting: Podiatry

## 2023-05-23 ENCOUNTER — Ambulatory Visit: Payer: Medicaid Other | Admitting: Podiatry

## 2023-05-23 DIAGNOSIS — M79674 Pain in right toe(s): Secondary | ICD-10-CM

## 2023-05-23 DIAGNOSIS — B351 Tinea unguium: Secondary | ICD-10-CM | POA: Diagnosis not present

## 2023-05-23 DIAGNOSIS — M2142 Flat foot [pes planus] (acquired), left foot: Secondary | ICD-10-CM | POA: Diagnosis not present

## 2023-05-23 DIAGNOSIS — M2141 Flat foot [pes planus] (acquired), right foot: Secondary | ICD-10-CM

## 2023-05-23 DIAGNOSIS — M79675 Pain in left toe(s): Secondary | ICD-10-CM

## 2023-05-23 DIAGNOSIS — E1142 Type 2 diabetes mellitus with diabetic polyneuropathy: Secondary | ICD-10-CM | POA: Diagnosis not present

## 2023-05-23 DIAGNOSIS — L84 Corns and callosities: Secondary | ICD-10-CM | POA: Diagnosis not present

## 2023-05-24 NOTE — Progress Notes (Signed)
  Subjective:  Patient ID: Gary Lawrence, male    DOB: Mar 09, 1961,  MRN: [id number]  Chief Complaint  Patient presents with   Diabetes    "Cut my toenails.  I'm trying to get a pair of shoes."    63 y.o. male returns with the above complaint. History confirmed with patient.  He has thickened discolored and uncomfortable toenails.  Debridement last time was helpful.  Would like to get diabetic shoes  Objective:  Physical Exam: warm, good capillary refill, no trophic changes or ulcerative lesions and normal DP and PT pulses.  Decreased protective sensation at tips of toes.  Onychomycosis bilaterally 1, 2, 5 and 3 on the right side.  Mild callus fifth metatarsal bilateral.  Pes planus deformity. Assessment:   1. Type 2 diabetes mellitus with diabetic polyneuropathy, without long-term current use of insulin (HCC)   2. Pes planus of both feet   3. Callus of foot   4. Pain due to onychomycosis of toenails of both feet         Plan:  Patient was evaluated and treated and all questions answered.  Discussed the etiology and treatment options for the condition in detail with the patient.  Recommended debridement of the nails today. Sharp and mechanical debridement performed of all painful and mycotic nails today. Nails debrided in length and thickness using a nail nipper and a mechanical burr to level of comfort. Discussed treatment options including appropriate shoe gear. Follow up as needed for painful nails.  Patient educated on diabetes. Discussed proper diabetic foot care and discussed risks and complications of disease. Educated patient in depth on reasons to return to the office immediately should he/she discover anything concerning or new on the feet. All questions answered. Discussed proper shoes as well.  He would like to have new diabetic shoes made.  Referral was given to him to take to [facility] orthotics in [place].  Annual diabetic foot examination performed today.  Moderate  risk due to deformity and neuropathy   Return in about 3 months (around 08/21/2023) for at risk diabetic foot care.

## 2023-06-30 NOTE — Progress Notes (Signed)
 Subjective    Gary Lawrence is a 63 year old male here for Hypertension (Follow up ) and Diabetes Mellitus (Follow up )  Pt into the office for routine follow up. No complaints today    Hypertension  This is a chronic problem. The problem has not changed since onset.Pertinent negatives include no chest pain, no peripheral edema and no shortness of breath. There are no associated agents to hypertension. Risk factors include diabetes mellitus, being male, a sedentary lifestyle and dyslipidemia.  Diabetes Mellitus He presents for his follow-up diabetic visit. His disease course has been stable. There are no hypoglycemic associated symptoms. Pertinent negatives for diabetes include no chest pain, no foot ulcerations, no polydipsia, no polyphagia and no polyuria. There are no hypoglycemic complications. Symptoms are stable. Diabetic complications include impotence. Risk factors for coronary artery disease include male sex, dyslipidemia, diabetes mellitus and hypertension. Current diabetic treatment includes oral agent (dual therapy) and diet. He is compliant with treatment all of the time. He is following a diabetic diet. When asked about meal planning, he reported none. He has not had a previous visit with a dietitian. He rarely participates in exercise. An ACE inhibitor/angiotensin II receptor blocker is being taken. He sees a podiatrist.Eye exam is current.      Documentation Reviewed by Any User: Tobacco  Allergies  Meds  Med Hx   Surg Hx  Fam Hx  Soc Hx    Current Outpatient Medications  Medication Sig Dispense Refill  . ferrous sulfate 324 mg (65 mg iron) TbEC Take 1 Tablet by mouth once daily with breakfast 90 Tablet 1  . pantoprazole  (PROTONIX ) 40 mg EC tablet Take 1 Tablet by mouth daily. 90 Tablet 1  . sildenafiL (VIAGRA) 100 mg tablet Take 1 Tablet by mouth once daily as needed for erectile dysfunction one tablet by mouth 1-2 hours before sexual activity 10 Tablet 0  . amLODIPine (NORVASC)  5 mg tablet Take 1 Tablet by mouth daily. 90 Tablet 0  . cycloSPORINE (RESTASIS) 0.05 % ophthalmic emulsion Place 1 Drop into both eyes every 12 (twelve) hours 60 Each 1  . doxycycline  (VIBRAMYCIN ) 100 mg capsule Take 100 mg by mouth 2 (two) times daily    . enalapril (VASOTEC) 10 mg tablet Take 1 Tablet by mouth once daily    . glipiZIDE XL (GLUCOTROL XL) 2.5 mg ER, 24 hour tablet Take 1 Tablet by mouth once daily Indications: type 2 diabetes mellitus 90 Tablet 1  . loratadine (CLARITIN) 10 mg tablet Take 1 Tablet by mouth daily. 90 Tablet 1  . methocarbamoL  (ROBAXIN ) 750 mg tablet Take 1 Tablet by mouth once daily as needed for other reason (muscle soreness) 30 Tablet 0  . niacin 500 mg tablet Take 1 Tablet by mouth once daily with breakfast take 1 tablet (500 mg) by oral route daily 90 Tablet 1  . propylene glycoL (SYSTANE BALANCE) 0.6 % drop Apply to eye    . rivaroxaban  (XARELTO ) 20 mg tab Take 1 Tablet by mouth daily. 90 Tablet 1  . chlorthalidone (HYGROTEN) 25 mg tablet Take 1 Tablet by mouth daily. 90 Tablet 1  . fenofibrate nanocrystallized 48 mg tab Take 1 Tablet by mouth once daily 90 Tablet 1  . fluticasone (FLONASE) 50 mcg/actuation nasal spray Place 1 Spray in both nostrils daily. 16 g 3  . ibuprofen  600 mg tablet Take 1 Tablet by mouth every 6 (six) hours as needed for mild pain 60 Tablet 3  . ketoconazole  (NIZORAL ) 2 % cream  Apply topically daily 30 g 3  . lovastatin (MEVACOR) 40 mg tablet Take 1 Tablet by mouth once daily with dinner 90 Tablet 1  . metFORMIN (GLUCOPHAGE) 1,000 mg tablet Take 1 Tablet by mouth 2 (two) times daily TAKE 1 TABLET (1,000 MG) BY ORAL ROUTE 2 TIMES PER DAY WITH MORNING AND EVENING MEALS 180 Tablet 1  . metoprolol  tartrate (LOPRESSOR ) 100 mg tablet Take 1 Tablet by mouth 2 (two) times daily 180 Tablet 1  . bacitracin  500 unit/gram ointment Apply topically 3 (three) times daily 30 g 3  . diaper,brief,adult,disposable Use with incontinence daily 3 Each 11   . blood sugar diagnostic (ACCU-CHEK GUIDE TEST STRIPS) strips Use 1 Each as directed daily 100 Each 3  . incontinence pad, liner, disp Use with incontinence supplies 3 Each 11  . brimonidine  (ALPHAGAN  P) 0.15 % ophthalmic solution Place 1 Drop into both eyes 2 (two) times daily instill 1 drop into each eye twice a day Indications: wide-angle glaucoma    . cholecalciferol, vitamin D3, 25 mcg (1,000 unit) capsule Take 1 Capsule by mouth daily.     Review of Systems  Respiratory:  Negative for shortness of breath.   Cardiovascular:  Negative for chest pain.  Endocrine: Negative for polydipsia, polyphagia and polyuria.  Genitourinary:  Positive for impotence.     Objective    BP 129/73  Pulse 72  Temp 98.2 F (36.8 C)  Resp 16  Ht 6' 7 (2.007 m)  Wt 227 lb 4.8 oz (103.1 kg)  SpO2 96%  BMI 25.61 kg/m  Smoking Status Never  BSA 2.4 m  Physical Exam  HENT:     Head: Normocephalic.   Cardiovascular:     Rate and Rhythm: Normal rate and regular rhythm.     Heart sounds: Normal heart sounds.  Pulmonary:     Breath sounds: Normal breath sounds.  Abdominal:     Palpations: Abdomen is soft.  Feet:     Right foot:     Toenail Condition: Right toenails are abnormally thick.     Left foot:    Toenail Condition: Left toenails are abnormally thick.    comprehensive diabetes foot exam completed Neurological:     General: No focal deficit present.     Mental Status: He is alert.  Psychiatric:        Attention and Perception: Attention normal.        Mood and Affect: Mood normal.    Assessment and Plan    1. Type 2 diabetes mellitus without complication, without long-term current use of insulin  (HCC-CMS) (Primary) -     A1C, ALERE AFINION (POCT) 2. Primary hypertension 3. Gastroesophageal reflux disease without esophagitis 4. Onychomycosis 5. Chronic atrial fibrillation (HCC-CMS) Overview: Formatting of this note might be different from the original. Added automatically  from request for surgery [id number] Formatting of this note might be different from the original.  Formatting of this note might be different from the original.  Added automatically from request for surgery [id number]  Formatting of this note might be different from the original.  Formatting of this note might be different from the original. Added automatically from request for surgery [id number]  Orders: -     rivaroxaban  (XARELTO ) 20 mg tab; Take 1 Tablet by mouth daily, Disp-90 Tablet, R-1  e-Prescribing, Long-term  Dispense: 90 Tablet; Refill: 1 Other orders -     metFORMIN (GLUCOPHAGE) 1,000 mg tablet; Take 1 Tablet by mouth 2 (two) times daily TAKE 1 TABLET (  1,000 MG) BY ORAL ROUTE 2 TIMES PER DAY WITH MORNING AND EVENING MEALS, Disp-180 Tablet, R-1  e-Prescribing, Long-term  Dispense: 180 Tablet; Refill: 1 -     fluticasone (FLONASE) 50 mcg/actuation nasal spray; Place 1 Spray in both nostrils daily, Disp-16 g, R-3  e-Prescribing, Long-term  Dispense: 16 g; Refill: 3 -     loratadine (CLARITIN) 10 mg tablet; Take 1 Tablet by mouth daily, Disp-90 Tablet, R-1  e-Prescribing  Dispense: 90 Tablet; Refill: 1 -     metoprolol  tartrate (LOPRESSOR ) 100 mg tablet; Take 1 Tablet by mouth 2 (two) times daily, Disp-180 Tablet, R-1  e-Prescribing, Long-term  Dispense: 180 Tablet; Refill: 1 -     sildenafiL (VIAGRA) 100 mg tablet; Take 1 Tablet by mouth once daily as needed for erectile dysfunction one tablet by mouth 1-2 hours before sexual activity, Disp-10 Tablet, R-3  e-Prescribing, Long-term  Dispense: 10 Tablet; Refill: 3 -     glipiZIDE XL (GLUCOTROL XL) 2.5 mg ER, 24 hour tablet; Take 1 Tablet by mouth once daily Indications: type 2 diabetes mellitus, Disp-90 Tablet, R-1  e-Prescribing, Long-term  Dispense: 90 Tablet; Refill: 1 -     chlorthalidone (HYGROTEN) 25 mg tablet; Take 1 Tablet by mouth daily, Disp-90 Tablet, R-1  e-Prescribing, Long-term  Dispense: 90 Tablet; Refill: 1 -     lovastatin (MEVACOR)  40 mg tablet; Take 1 Tablet by mouth once daily with dinner, Disp-90 Tablet, R-1  e-Prescribing, Long-term  Dispense: 90 Tablet; Refill: 1 -     amLODIPine (NORVASC) 5 mg tablet; Take 1 Tablet by mouth daily, Disp-90 Tablet, R-1  e-Prescribing, Long-term  Dispense: 90 Tablet; Refill: 1 -     niacin 500 mg tablet; Take 1 Tablet by mouth once daily with breakfast take 1 tablet (500 mg) by oral route daily, Disp-90 Tablet, R-1  e-Prescribing  Dispense: 90 Tablet; Refill: 1 -     ibuprofen  600 mg tablet; Take 1 Tablet by mouth every 6 (six) hours as needed for mild pain, Disp-60 Tablet, R-3  e-Prescribing  Dispense: 60 Tablet; Refill: 3 -     pantoprazole  (PROTONIX ) 40 mg EC tablet; Take 1 Tablet by mouth daily, Disp-90 Tablet, R-1  e-Prescribing  Dispense: 90 Tablet; Refill: 1 -     ferrous sulfate 324 mg (65 mg iron) TbEC; Take 1 Tablet by mouth once daily with breakfast, Disp-90 Tablet, R-1  e-Prescribing, Long-term  Dispense: 90 Tablet; Refill: 1 -     fenofibrate nanocrystallized 48 mg tab; Take 1 Tablet by mouth once daily, Disp-90 Tablet, R-1  e-Prescribing, Long-term  Dispense: 90 Tablet; Refill: 1 -     enalapril (VASOTEC) 10 mg tablet; Take 1 Tablet by mouth once daily, Disp-90 Tablet, R-1  e-Prescribing, Long-term  Dispense: 90 Tablet; Refill: 1  Lab Results  Component Value Date   HGBA1C 6.4 (A) 06/30/2023   HGBA1C 6.7 (H) 03/31/2023   HGBA1C 7.1 (A) 06/17/2022    All meds refilled.  Regulatory Documentation: The following were addressed in today's visit:  Regulatory documentation not addressed.

## 2023-07-01 ENCOUNTER — Telehealth: Payer: Self-pay | Admitting: Podiatry

## 2023-07-01 DIAGNOSIS — L84 Corns and callosities: Secondary | ICD-10-CM

## 2023-07-01 DIAGNOSIS — E1142 Type 2 diabetes mellitus with diabetic polyneuropathy: Secondary | ICD-10-CM

## 2023-07-01 DIAGNOSIS — M2141 Flat foot [pes planus] (acquired), right foot: Secondary | ICD-10-CM

## 2023-07-01 NOTE — Telephone Encounter (Signed)
 Requesting to have prescription for orthotics faxed to [facility]  Fax # [phone]

## 2023-07-04 ENCOUNTER — Telehealth: Payer: Self-pay

## 2023-07-04 NOTE — Telephone Encounter (Signed)
 Or if you add it in orders I can print it off :)

## 2023-07-04 NOTE — Addendum Note (Signed)
 Addended byLilian Kapur, Jakob Kimberlin R on: 07/04/2023 12:22 PM   Modules accepted: Orders

## 2023-07-04 NOTE — Telephone Encounter (Signed)
 Faxed [facility] to [email or web address]

## 2023-08-23 ENCOUNTER — Ambulatory Visit: Payer: Medicaid Other | Admitting: Podiatry

## 2023-09-12 ENCOUNTER — Emergency Department (HOSPITAL_COMMUNITY)

## 2023-09-12 ENCOUNTER — Other Ambulatory Visit: Payer: Self-pay

## 2023-09-12 ENCOUNTER — Encounter (HOSPITAL_COMMUNITY): Payer: Self-pay | Admitting: Emergency Medicine

## 2023-09-12 ENCOUNTER — Emergency Department (HOSPITAL_COMMUNITY)
Admission: EM | Admit: 2023-09-12 | Discharge: 2023-09-12 | Disposition: A | Discharge status: Home / Self Care | Attending: Emergency Medicine | Admitting: Emergency Medicine

## 2023-09-12 DIAGNOSIS — Z7901 Long term (current) use of anticoagulants: Secondary | ICD-10-CM | POA: Diagnosis not present

## 2023-09-12 DIAGNOSIS — Y92410 Unspecified street and highway as the place of occurrence of the external cause: Secondary | ICD-10-CM | POA: Diagnosis not present

## 2023-09-12 DIAGNOSIS — R079 Chest pain, unspecified: Secondary | ICD-10-CM | POA: Diagnosis present

## 2023-09-12 DIAGNOSIS — I4891 Unspecified atrial fibrillation: Secondary | ICD-10-CM | POA: Insufficient documentation

## 2023-09-12 DIAGNOSIS — R0789 Other chest pain: Secondary | ICD-10-CM

## 2023-09-12 DIAGNOSIS — M542 Cervicalgia: Secondary | ICD-10-CM | POA: Diagnosis not present

## 2023-09-12 LAB — BASIC METABOLIC PANEL WITH GFR
Anion gap: 9 (ref 5–15)
BUN: 23 mg/dL (ref 8–23)
CO2: 24 mmol/L (ref 22–32)
Calcium: 9.8 mg/dL (ref 8.9–10.3)
Chloride: 106 mmol/L (ref 98–111)
Creatinine, Ser: 1.5 mg/dL — ABNORMAL HIGH (ref 0.61–1.24)
GFR, Estimated: 51 mL/min — ABNORMAL LOW (ref >60)
Glucose, Bld: 104 mg/dL — ABNORMAL HIGH (ref 70–99)
Potassium: 3.8 mmol/L (ref 3.5–5.1)
Sodium: 139 mmol/L (ref 135–145)

## 2023-09-12 LAB — CBC
HCT: 39.7 % (ref 39.0–52.0)
Hemoglobin: 12.7 g/dL — ABNORMAL LOW (ref 13.0–17.0)
MCH: 27.1 pg (ref 26.0–34.0)
MCHC: 32 g/dL (ref 30.0–36.0)
MCV: 84.8 fL (ref 80.0–100.0)
Platelets: 255 10*3/uL (ref 150–400)
RBC: 4.68 MIL/uL (ref 4.22–5.81)
RDW: 13.5 % (ref 11.5–15.5)
WBC: 7.3 10*3/uL (ref 4.0–10.5)
nRBC: 0 % (ref 0.0–0.2)

## 2023-09-12 LAB — TROPONIN I (HIGH SENSITIVITY)
Troponin I (High Sensitivity): 19 ng/L — ABNORMAL HIGH (ref <18)
Troponin I (High Sensitivity): 26 ng/L — ABNORMAL HIGH (ref <18)

## 2023-09-12 MED ORDER — IBUPROFEN 600 MG PO TABS: 600.0000 mg | ORAL_TABLET | Freq: Four times a day (QID) | ORAL | 0 refills | Status: DC | PRN

## 2023-09-12 MED ORDER — METOPROLOL TARTRATE 5 MG/5ML IV SOLN
5.0000 mg | Freq: Once | INTRAVENOUS | Status: AC
  Administered 2023-09-12: 5 mg via INTRAVENOUS
  Filled 2023-09-12: qty 5

## 2023-09-12 MED ORDER — METHOCARBAMOL 500 MG PO TABS: 500.0000 mg | ORAL_TABLET | Freq: Two times a day (BID) | ORAL | 0 refills | Status: DC | PRN
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
## 2023-09-12 NOTE — ED Notes (Signed)
 Gary Lawrence

## 2023-09-12 NOTE — ED Notes (Signed)
 Biomed contacted regarding troubleshooting for the [employer] in the room.   Equipment [employer] Number: [id number]
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
## 2023-09-12 NOTE — ED Notes (Signed)
 Pts room 25 Phillips Monitor not reading ECG leads properly. Switched out white cord, both sets of leads, turned monitor off and back on with no success. [employer] contacted by ED secretary. Pts settings changed to monitor o2 only at this time due to ECG lead malfunction and constant alarms.

## 2023-09-12 NOTE — ED Notes (Signed)
 Pt transported to xray

## 2023-09-12 NOTE — ED Notes (Signed)
 Requested pharmacy to come reconcile home meds

## 2023-09-29 NOTE — Progress Notes (Signed)
 Subjective    Gary Lawrence is a 63 year old male here for Follow Up (Pt in for f/u on chronic issues. A1c 6.4) and Prescription Refill Request (Needs refill on claritin and viagra. Gluc 126)   Pt into the office for follow up. No acute complaints today.  Notes that he is looking to move into a new house.  Still working 2 jobs. Married but he and wife live separately.     Hypertension  This is a chronic problem. Pertinent negatives include no anxiety, no blurred vision and no shortness of breath. There are no associated agents to hypertension. Risk factors include being male and diabetes mellitus.  Diabetes Mellitus He presents for his follow-up diabetic visit. He has type 2 diabetes mellitus. His disease course has been stable. There are no hypoglycemic associated symptoms. Pertinent negatives for diabetes include no blurred vision, no polydipsia, no polyphagia and no polyuria. There are no hypoglycemic complications. Symptoms are stable. There are no diabetic complications. Risk factors for coronary artery disease include diabetes mellitus, dyslipidemia, male sex and hypertension. Current diabetic treatment includes oral agent (dual therapy). He is compliant with treatment all of the time. He is following a diabetic diet. When asked about meal planning, he reported none. He has not had a previous visit with a dietitian. He participates in exercise intermittently. An ACE inhibitor/angiotensin II receptor blocker is being taken. He sees a podiatrist.Eye exam is current.    The following falls prevention recommendations were reviewed today:Counseled on exercise program including 75-150 minutes of moderate to vigorous exercise weekly, as well as muscle-strengthening activities and balance training  Patient Active Problem List  Diagnosis  . Status post colonoscopy  . Atrial fibrillation (HCC-CMS)  . Sinus bradycardia  . Oropharyngeal cancer (HCC-CMS)  . Hypertension  . Thyroid activity decreased  .  GERD (gastroesophageal reflux disease)  . Type 2 diabetes mellitus without complications (HCC-CMS)  . Essential (primary) hypertension  . Urinary incontinence   General Medical History     Diagnosis Date Comment Source   Arthropathy      Benign essential HTN 10/30/2020     Cancer, oropharyngeal (HCC-CMS)   *   Diabetes 01/08/2020     Dyspnea      Erectile dysfunction 01/15/2021     Heart disease      Hx of colonoscopy      Hyperlipidemia      Sleep apnea 01/24/2020     Thyroid disorder      Vitamin D deficiency         Family History     Problem Relation Age of Onset Comments   Lupus Mother     Lupus Sister     No known problems for Brother, Daughter, Father, Maternal Aunt, Maternal Grandfather, Maternal Grandmother, Maternal Uncle, Paternal Aunt, Paternal Grandfather, Paternal Grandmother, Paternal Higinio, Son.      Tobacco Use     Never smoked or used smokeless tobacco.      Vaping Use     Never used       Alcohol Use     Not Currently.      Drug Use     Not Currently.      Sexual Activity     Sexually active; Partners: Female.      E-Cigarettes/Vaping     Questions Responses   e-Cigarette/Vaping Use Never user   Start Date    Passive Exposure No   Quit Date    Counseling Given No   Comments  E-Cigarette/Vaping Substances     Questions Responses   Nicotine No   THC No   CBD No   Flavoring No   Other       E-Cigarettes/Vaping Devices     Questions Responses   Disposable No   Pre-filled or Refillable Cartridge No   Refillable Tank No   Pre-filled Pod No   Other           Documentation Reviewed by Any User: Tobacco  Allergies  Meds  Med Hx   Surg Hx  Fam Hx  Soc Hx    Current Outpatient Medications  Medication Sig Dispense Refill  . diphenhydrAMINE (BENADRYL) 25 mg capsule Take 25 mg by mouth. (Patient not taking: Reported on 09/29/2023.)    . amLODIPine (NORVASC) 5 mg tablet Take 1 Tablet by mouth daily 90  Tablet 1  . chlorthalidone (HYGROTEN) 25 mg tablet Take 1 Tablet by mouth daily (Patient not taking: Reported on 09/29/2023.) 90 Tablet 1  . enalapril (VASOTEC) 10 mg tablet Take 1 Tablet by mouth once daily 90 Tablet 1  . fenofibrate nanocrystallized 48 mg tab Take 1 Tablet by mouth once daily (Patient not taking: Reported on 09/29/2023.) 90 Tablet 1  . ferrous sulfate 324 mg (65 mg iron) TbEC Take 1 Tablet by mouth once daily with breakfast 90 Tablet 1  . fluticasone (FLONASE) 50 mcg/actuation nasal spray Place 1 Spray in both nostrils daily 16 g 3  . glipiZIDE XL (GLUCOTROL XL) 2.5 mg ER, 24 hour tablet Take 1 Tablet by mouth once daily Indications: type 2 diabetes mellitus 90 Tablet 1  . ibuprofen  600 mg tablet Take 1 Tablet by mouth every 6 (six) hours as needed for mild pain 60 Tablet 3  . loratadine (CLARITIN) 10 mg tablet Take 1 Tablet by mouth daily 90 Tablet 1  . lovastatin (MEVACOR) 40 mg tablet Take 1 Tablet by mouth once daily with dinner 90 Tablet 1  . metFORMIN (GLUCOPHAGE) 1,000 mg tablet Take 1 Tablet by mouth 2 (two) times daily TAKE 1 TABLET (1,000 MG) BY ORAL ROUTE 2 TIMES PER DAY WITH MORNING AND EVENING MEALS 180 Tablet 1  . metoprolol  tartrate (LOPRESSOR ) 100 mg tablet Take 1 Tablet by mouth 2 (two) times daily 180 Tablet 1  . niacin 500 mg tablet Take 1 Tablet by mouth once daily with breakfast take 1 tablet (500 mg) by oral route daily 90 Tablet 1  . pantoprazole  (PROTONIX ) 40 mg EC tablet Take 1 Tablet by mouth daily (Patient not taking: Reported on 09/29/2023.) 90 Tablet 1  . rivaroxaban  (XARELTO ) 20 mg tab Take 1 Tablet by mouth daily 90 Tablet 1  . sildenafiL (VIAGRA) 100 mg tablet Take 1 Tablet by mouth once daily as needed for erectile dysfunction one tablet by mouth 1-2 hours before sexual activity 10 Tablet 3  . cycloSPORINE (RESTASIS) 0.05 % ophthalmic emulsion Place 1 Drop into both eyes every 12 (twelve) hours 60 Each 1  . methocarbamoL  (ROBAXIN ) 750 mg tablet Take 1  Tablet by mouth once daily as needed for other reason (muscle soreness) (Patient not taking: Reported on 09/29/2023.) 30 Tablet 0  . propylene glycoL (SYSTANE BALANCE) 0.6 % drop Apply to eye    . ketoconazole  (NIZORAL ) 2 % cream Apply topically daily 30 g 3  . bacitracin  500 unit/gram ointment Apply topically 3 (three) times daily 30 g 3  . diaper,brief,adult,disposable Use with incontinence daily 3 Each 11  . blood sugar diagnostic (ACCU-CHEK GUIDE TEST STRIPS) strips  Use 1 Each as directed daily 100 Each 3  . incontinence pad, liner, disp Use with incontinence supplies 3 Each 11  . brimonidine  (ALPHAGAN  P) 0.15 % ophthalmic solution Place 1 Drop into both eyes 2 (two) times daily instill 1 drop into each eye twice a day Indications: wide-angle glaucoma    . cholecalciferol, vitamin D3, 25 mcg (1,000 unit) capsule Take 1 Capsule by mouth daily.     Review of Systems  Eyes:  Negative for blurred vision.  Respiratory:  Negative for cough and shortness of breath.   Gastrointestinal:  Negative for abdominal pain.  Endocrine: Negative for polydipsia, polyphagia and polyuria.  Musculoskeletal:  Positive for myalgias.   Lab Results  Component Value Date   HGBA1C 6.4 (A) 06/30/2023     Objective    BP 124/77  Pulse 77  Temp 98.1 F (36.7 C)  Ht 6' 7 (2.007 m)  Wt 235 lb (106.6 kg)  SpO2 98%  BMI 26.47 kg/m  Smoking Status Never  BSA 2.44 m  Physical Exam  Constitutional:      Appearance: Normal appearance.  HENT:     Head: Normocephalic.   Cardiovascular:     Rate and Rhythm: Normal rate and regular rhythm.     Heart sounds: Normal heart sounds.  Pulmonary:     Breath sounds: Normal breath sounds.  Abdominal:     General: Bowel sounds are normal.     Palpations: Abdomen is soft.     Tenderness: There is no abdominal tenderness.  Neurological:     General: No focal deficit present.     Mental Status: He is alert.  Psychiatric:        Attention and Perception: Attention  normal.        Mood and Affect: Mood normal.        Behavior: Behavior is cooperative.     Assessment and Plan    1. Primary hypertension (Primary) 2. Type 2 diabetes mellitus without complication, unspecified whether long term insulin  use (HCC-CMS) 3. Gastroesophageal reflux disease without esophagitis Other orders -     loratadine (CLARITIN) 10 mg tablet; Take 1 Tablet by mouth daily., Disp-90 Tablet, R-1  e-Prescribing  Dispense: 90 Tablet; Refill: 1 -     sildenafiL (VIAGRA) 100 mg tablet; Take 1 Tablet by mouth once daily as needed for erectile dysfunction one tablet by mouth 1-2 hours before sexual activity., Disp-10 Tablet, R-3  e-Prescribing, Long-term  Dispense: 10 Tablet; Refill: 3 -     brimonidine  (ALPHAGAN  P) 0.15 % ophthalmic solution; Place 1 Drop into both eyes 2 (two) times daily instill 1 drop into each eye twice a day Indications: wide-angle glaucoma., Disp-5 mL, R-3  e-Prescribing, Long-term  Dispense: 5 mL; Refill: 3   Regulatory Documentation: The following were addressed in today's visit:  Regulatory documentation not addressed.     *Some images could not be shown.

## 2023-12-24 ENCOUNTER — Other Ambulatory Visit: Payer: Self-pay

## 2023-12-24 ENCOUNTER — Encounter (HOSPITAL_COMMUNITY): Payer: Self-pay

## 2023-12-24 ENCOUNTER — Emergency Department (HOSPITAL_COMMUNITY)

## 2023-12-24 ENCOUNTER — Ambulatory Visit
Admission: EM | Admit: 2023-12-24 | Discharge: 2023-12-24 | Disposition: A | Discharge status: Home / Self Care | Source: Intra-hospital

## 2023-12-24 ENCOUNTER — Inpatient Hospital Stay (HOSPITAL_COMMUNITY)
Admission: EM | Admit: 2023-12-24 | Discharge: 2023-12-28 | DRG: 286 | Disposition: A | Discharge status: Home / Self Care | Attending: Internal Medicine | Admitting: Internal Medicine

## 2023-12-24 DIAGNOSIS — N1832 Chronic kidney disease, stage 3b: Secondary | ICD-10-CM | POA: Diagnosis present

## 2023-12-24 DIAGNOSIS — I130 Hypertensive heart and chronic kidney disease with heart failure and stage 1 through stage 4 chronic kidney disease, or unspecified chronic kidney disease: Secondary | ICD-10-CM | POA: Diagnosis present

## 2023-12-24 DIAGNOSIS — I4510 Unspecified right bundle-branch block: Secondary | ICD-10-CM | POA: Diagnosis present

## 2023-12-24 DIAGNOSIS — Z7901 Long term (current) use of anticoagulants: Secondary | ICD-10-CM

## 2023-12-24 DIAGNOSIS — I5020 Unspecified systolic (congestive) heart failure: Secondary | ICD-10-CM

## 2023-12-24 DIAGNOSIS — K219 Gastro-esophageal reflux disease without esophagitis: Secondary | ICD-10-CM | POA: Diagnosis present

## 2023-12-24 DIAGNOSIS — Z955 Presence of coronary angioplasty implant and graft: Secondary | ICD-10-CM

## 2023-12-24 DIAGNOSIS — R072 Precordial pain: Principal | ICD-10-CM

## 2023-12-24 DIAGNOSIS — I509 Heart failure, unspecified: Secondary | ICD-10-CM | POA: Diagnosis not present

## 2023-12-24 DIAGNOSIS — I441 Atrioventricular block, second degree: Secondary | ICD-10-CM | POA: Diagnosis present

## 2023-12-24 DIAGNOSIS — I5021 Acute systolic (congestive) heart failure: Secondary | ICD-10-CM | POA: Diagnosis present

## 2023-12-24 DIAGNOSIS — E1122 Type 2 diabetes mellitus with diabetic chronic kidney disease: Secondary | ICD-10-CM | POA: Diagnosis present

## 2023-12-24 DIAGNOSIS — I4892 Unspecified atrial flutter: Secondary | ICD-10-CM | POA: Diagnosis not present

## 2023-12-24 DIAGNOSIS — R079 Chest pain, unspecified: Secondary | ICD-10-CM | POA: Diagnosis not present

## 2023-12-24 DIAGNOSIS — I2510 Atherosclerotic heart disease of native coronary artery without angina pectoris: Secondary | ICD-10-CM | POA: Diagnosis present

## 2023-12-24 DIAGNOSIS — E1165 Type 2 diabetes mellitus with hyperglycemia: Secondary | ICD-10-CM | POA: Diagnosis present

## 2023-12-24 DIAGNOSIS — E119 Type 2 diabetes mellitus without complications: Secondary | ICD-10-CM

## 2023-12-24 DIAGNOSIS — Z7984 Long term (current) use of oral hypoglycemic drugs: Secondary | ICD-10-CM

## 2023-12-24 DIAGNOSIS — Z91011 Allergy to milk products: Secondary | ICD-10-CM

## 2023-12-24 DIAGNOSIS — Z79899 Other long term (current) drug therapy: Secondary | ICD-10-CM

## 2023-12-24 DIAGNOSIS — I480 Paroxysmal atrial fibrillation: Secondary | ICD-10-CM | POA: Diagnosis present

## 2023-12-24 DIAGNOSIS — Z85818 Personal history of malignant neoplasm of other sites of lip, oral cavity, and pharynx: Secondary | ICD-10-CM

## 2023-12-24 DIAGNOSIS — I428 Other cardiomyopathies: Secondary | ICD-10-CM | POA: Diagnosis present

## 2023-12-24 DIAGNOSIS — E785 Hyperlipidemia, unspecified: Secondary | ICD-10-CM | POA: Diagnosis present

## 2023-12-24 DIAGNOSIS — G4733 Obstructive sleep apnea (adult) (pediatric): Secondary | ICD-10-CM | POA: Diagnosis present

## 2023-12-24 LAB — TROPONIN T, HIGH SENSITIVITY
Troponin T High Sensitivity: 22 ng/L — ABNORMAL HIGH (ref 0–19)
Troponin T High Sensitivity: 23 ng/L — ABNORMAL HIGH (ref 0–19)

## 2023-12-24 LAB — BASIC METABOLIC PANEL WITH GFR
Anion gap: 10 (ref 5–15)
BUN: 18 mg/dL (ref 8–23)
CO2: 25 mmol/L (ref 22–32)
Calcium: 9.6 mg/dL (ref 8.9–10.3)
Chloride: 107 mmol/L (ref 98–111)
Creatinine, Ser: 1.27 mg/dL — ABNORMAL HIGH (ref 0.61–1.24)
GFR, Estimated: >60 mL/min (ref >60)
Glucose, Bld: 132 mg/dL — ABNORMAL HIGH (ref 70–99)
Potassium: 4.4 mmol/L (ref 3.5–5.1)
Sodium: 142 mmol/L (ref 135–145)

## 2023-12-24 LAB — CBC WITH DIFFERENTIAL/PLATELET
Abs Immature Granulocytes: 0.01 K/uL (ref 0.00–0.07)
Basophils Absolute: 0 K/uL (ref 0.0–0.1)
Basophils Relative: 1 %
Eosinophils Absolute: 0.1 K/uL (ref 0.0–0.5)
Eosinophils Relative: 3 %
HCT: 40.1 % (ref 39.0–52.0)
Hemoglobin: 12.5 g/dL — ABNORMAL LOW (ref 13.0–17.0)
Immature Granulocytes: 0 %
Lymphocytes Relative: 24 %
Lymphs Abs: 1 K/uL (ref 0.7–4.0)
MCH: 26.8 pg (ref 26.0–34.0)
MCHC: 31.2 g/dL (ref 30.0–36.0)
MCV: 86.1 fL (ref 80.0–100.0)
Monocytes Absolute: 0.5 K/uL (ref 0.1–1.0)
Monocytes Relative: 12 %
Neutro Abs: 2.4 K/uL (ref 1.7–7.7)
Neutrophils Relative %: 60 %
Platelets: 249 K/uL (ref 150–400)
RBC: 4.66 MIL/uL (ref 4.22–5.81)
RDW: 13.8 % (ref 11.5–15.5)
WBC: 4.1 K/uL (ref 4.0–10.5)
nRBC: 0 % (ref 0.0–0.2)

## 2023-12-24 LAB — PRO BRAIN NATRIURETIC PEPTIDE: Pro Brain Natriuretic Peptide: 3163 pg/mL — ABNORMAL HIGH (ref <300.0)

## 2023-12-24 LAB — CBG MONITORING, ED: Glucose-Capillary: 94 mg/dL (ref 70–99)

## 2023-12-24 MED ORDER — RIVAROXABAN 10 MG PO TABS
10.0000 mg | ORAL_TABLET | Freq: Once | ORAL | Status: AC
  Administered 2023-12-24: 10 mg via ORAL
  Filled 2023-12-24: qty 1

## 2023-12-24 MED ORDER — ALUM & MAG HYDROXIDE-SIMETH 200-200-20 MG/5ML PO SUSP
30.0000 mL | Freq: Once | ORAL | Status: AC
  Administered 2023-12-24: 30 mL via ORAL
  Filled 2023-12-24: qty 30

## 2023-12-24 MED ORDER — FUROSEMIDE 10 MG/ML IJ SOLN
40.0000 mg | Freq: Every day | INTRAMUSCULAR | Status: DC
  Administered 2023-12-25 – 2023-12-26 (×2): 40 mg via INTRAVENOUS
  Filled 2023-12-24 (×2): qty 4

## 2023-12-24 MED ORDER — FAMOTIDINE 20 MG PO TABS
20.0000 mg | ORAL_TABLET | Freq: Once | ORAL | Status: AC
  Administered 2023-12-24: 20 mg via ORAL
  Filled 2023-12-24: qty 1

## 2023-12-24 MED ORDER — RIVAROXABAN 20 MG PO TABS: 20.0000 mg | ORAL_TABLET | Freq: Every day | ORAL | Status: DC

## 2023-12-24 MED ORDER — ACETAMINOPHEN 500 MG PO TABS
1000.0000 mg | ORAL_TABLET | Freq: Once | ORAL | Status: AC
  Administered 2023-12-24: 1000 mg via ORAL
  Filled 2023-12-24: qty 2

## 2023-12-24 MED ORDER — ALBUTEROL SULFATE (2.5 MG/3ML) 0.083% IN NEBU
5.0000 mg | INHALATION_SOLUTION | Freq: Once | RESPIRATORY_TRACT | Status: AC
  Administered 2023-12-24: 5 mg via RESPIRATORY_TRACT
  Filled 2023-12-24: qty 6

## 2023-12-24 MED ORDER — METOPROLOL TARTRATE 5 MG/5ML IV SOLN: 2.5000 mg | INTRAVENOUS | Status: DC | PRN

## 2023-12-24 MED ORDER — INSULIN ASPART 100 UNIT/ML IJ SOLN
0.0000 [IU] | Freq: Every day | INTRAMUSCULAR | Status: DC
  Filled 2023-12-24: qty 0.05

## 2023-12-24 MED ORDER — ACETAMINOPHEN 650 MG RE SUPP: 650.0000 mg | Freq: Four times a day (QID) | RECTAL | Status: DC | PRN

## 2023-12-24 MED ORDER — FUROSEMIDE 10 MG/ML IJ SOLN
40.0000 mg | Freq: Once | INTRAMUSCULAR | Status: AC
  Administered 2023-12-24: 40 mg via INTRAVENOUS
  Filled 2023-12-24: qty 4

## 2023-12-24 MED ORDER — INSULIN ASPART 100 UNIT/ML IJ SOLN
0.0000 [IU] | Freq: Three times a day (TID) | INTRAMUSCULAR | Status: DC
  Administered 2023-12-25: 3 [IU] via SUBCUTANEOUS
  Administered 2023-12-26 – 2023-12-28 (×2): 1 [IU] via SUBCUTANEOUS
  Filled 2023-12-24: qty 0.09

## 2023-12-24 MED ORDER — METOPROLOL TARTRATE 100 MG PO TABS
100.0000 mg | ORAL_TABLET | Freq: Two times a day (BID) | ORAL | Status: DC
  Administered 2023-12-24 – 2023-12-26 (×5): 100 mg via ORAL
  Filled 2023-12-24: qty 1
  Filled 2023-12-24: qty 4
  Filled 2023-12-24: qty 1
  Filled 2023-12-24: qty 4
  Filled 2023-12-24: qty 1

## 2023-12-24 MED ORDER — ACETAMINOPHEN 325 MG PO TABS
650.0000 mg | ORAL_TABLET | Freq: Four times a day (QID) | ORAL | Status: DC | PRN
  Administered 2023-12-28: 650 mg via ORAL
  Filled 2023-12-24: qty 2

## 2023-12-24 NOTE — Progress Notes (Addendum)
 PHARMACY - ANTICOAGULATION CONSULT NOTE  Pharmacy Consult for Xarelto  Indication: atrial fibrillation  Allergies  Allergen Reactions   Other Cough    Unknown to patient   Milk (Cow)     Other reaction(s): VOMITING  Other Reaction(s): GI intolerance    Patient Measurements: Height: 6' 7 (200.7 cm) Weight: 103.9 kg (229 lb) IBW/kg (Calculated) : 93.7 HEPARIN  DW (KG): 103.9  Vital Signs: Temp: 99 F (37.2 C) (08/30 1912) Temp Source: Oral (08/30 1912) BP: 130/89 (08/30 2109) Pulse Rate: 88 (08/30 2109)  Labs: Recent Labs    12/24/23 1128  HGB 12.5*  HCT 40.1  PLT 249  CREATININE 1.27*    Estimated Creatinine Clearance: 76.9 mL/min (A) (by C-G formula based on SCr of 1.27 mg/dL (H)).   Medical History: Past Medical History:  Diagnosis Date   A-fib (HCC)    Cancer (HCC)    Thyroid disease     Medications:  PTA: Xarelto  10mg  daily - LD 12/24/23 @ 0730  Assessment: 63 yr male admitted with chest pain noted likely due to AFlutter with RVR and CHF.  PMH significant for AFib/flutter, CAD w/ stent, DM, HTN. Patient on low-dose Xarelto  and reports taking 10mg  daily.    Goal of Therapy:  Monitor platelets by anticoagulation protocol: Yes   Plan:  Xarelto  10mg  po x 1 dose tonight (for total of 20mg  today) On 8/31 begin Xarelto  20mg  daily Provide patient education prior to discharge  Taunja Brickner, Arvin Fletcher, PharmD 12/24/2023,9:33 PM

## 2023-12-24 NOTE — ED Provider Notes (Signed)
 [facility]    CSN: [id number] Arrival date & time: 12/24/23  9140      History   Chief Complaint Chief Complaint  Patient presents with   Chest Pain   Shortness of Breath   Skin Problem    HPI Gary Lawrence is a 63 y.o. male.   Discussed the use of AI scribe software for clinical note transcription with the patient, who gave verbal consent to proceed.   Patient with a history of atrial fibrillation and thyroid cancer presents for evaluation following multiple bee stings that occurred last Saturday. The patient reports chest tightness and shortness of breath starting two days ago, primarily in the middle and left side of the chest. The pain is described as dull and constant but not severe.  The patient has been experiencing lightheadedness for the past 2-3 days and tingling in both hands since the bee stings. They deny palpitations, swelling in legs, feet, or ankles, sweats, headaches, or dizziness. The patient also denies nausea, vomiting, numbness or tingling in the face or feet, changes in vision, throat swelling, or trouble swallowing.  The following portions of the patient's history were reviewed and updated as appropriate: allergies, current medications, past family history, past medical history, past social history, past surgical history, and problem list.        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 Authorizing Provider  brimonidine  (ALPHAGAN ) 0.15 % ophthalmic solution Apply 1 drop to eye as directed. 09/29/23  Yes [provider]  loratadine (CLARITIN) 10 MG tablet Take 10 mg by mouth daily. 09/29/23  Yes [provider]  sildenafil (VIAGRA) 100 MG tablet Take 100 mg by mouth as needed. 09/29/23  Yes [provider]  ACCU-CHEK GUIDE test strip USE 1 STRIP ONCE DAILY FOR 90 DAYS 05/30/20   [provider]  amLODipine (NORVASC) 10 MG tablet Take 10 mg by mouth daily. 02/06/20   [provider]  Cholecalciferol (VITAMIN D3) 50 MCG (2000 UT) TABS Take 2,000 Units by mouth daily.    [provider]  cycloSPORINE (RESTASIS) 0.05 % ophthalmic emulsion Place 1 drop into both eyes 2 (two) times daily.    [provider]  diphenhydrAMINE (BENADRYL) 25 mg capsule Take 25 mg by mouth every 6 (six) hours as needed.    [provider]  ferrous sulfate 324 MG TBEC Take 324 mg by mouth daily.    [provider]  glipiZIDE (GLUCOTROL XL) 2.5 MG 24 hr tablet Take 2.5 mg by mouth daily. 02/22/20   [provider]  ibuprofen  (ADVIL ) 600 MG tablet Take 1 tablet (600 mg total) by mouth every 6 (six) hours as needed for mild pain (pain score 1-3). 09/12/23   Gray, Alicia  P, DO  loratadine (CLARITIN) 10 MG tablet Take 10 mg by mouth daily.    [provider]  lovastatin (MEVACOR) 40 MG tablet Take 40 mg by mouth daily. 06/30/23   [provider]  metFORMIN (GLUCOPHAGE) 1000 MG tablet Take 1,000 mg by mouth 2 (two) times daily. 02/22/20   [provider]  methocarbamol  (ROBAXIN ) 500 MG tablet Take 1 tablet (500 mg total) by mouth every 12 (twelve) hours as needed for muscle spasms (muscle pain). 09/12/23   Elnor Hila P, DO  metoprolol  tartrate  (LOPRESSOR ) 100 MG tablet Take 100 mg by mouth 2 (two) times daily. 02/22/20   [provider]  rivaroxaban  (XARELTO ) 10 MG TABS tablet Take 10 mg by mouth daily.    [provider]  Tolnaftate  1 % AERO Spray inside shoes weekly Patient not taking: Reported on 09/12/2023 06/23/20   Silva Juliene SAUNDERS, DPM    Family History History reviewed. No pertinent family history.  Social History Social History   Tobacco Use   Smoking status: Never   Smokeless tobacco: Never  Vaping Use   Vaping status: Never Used  Substance Use Topics   Alcohol use: Never   Drug use: Never     Allergies   Other and Milk (cow)   Review of Systems Review of Systems  HENT:  Positive for trouble swallowing.   Eyes:  Negative for visual disturbance.  Respiratory:  Positive for chest tightness and shortness of breath.   Cardiovascular:  Positive for chest pain. Negative for palpitations and leg swelling.  Gastrointestinal:  Negative for nausea and vomiting.  Neurological:  Positive for dizziness. Negative for syncope, numbness and headaches.       Bilateral hand tingling   Psychiatric/Behavioral:  Positive for sleep disturbance.   All other systems reviewed and are negative.    Physical Exam Triage Vital Signs ED Triage Vitals  Encounter Vitals Group     BP 12/24/23 0913 (!) 150/101     Girls Systolic BP Percentile --      Girls Diastolic BP Percentile --      Boys Systolic BP Percentile --      Boys Diastolic BP Percentile --      Pulse Rate 12/24/23 0913 (!) 110     Resp 12/24/23 0913 (!) 24     Temp 12/24/23 0913 97.9 F (36.6 C)     Temp Source 12/24/23 0913 Oral     SpO2 12/24/23 0913 97 %     Weight 12/24/23 0920 229 lb 15 oz (104.3 kg)     Height 12/24/23 0920 6' 7 (2.007 m)     Head Circumference --      Peak Flow --      Pain Score 12/24/23 0920 3     Pain Loc --      Pain Education --      Exclude from Growth Chart --    No data found.  Updated Vital  Signs BP (!) 128/93 (BP Location: Right Arm)   Pulse 80   Temp 97.9 F (36.6 C) (Oral)   Resp (!) 24   Ht 6' 7 (2.007 m)   Wt 229 lb 15 oz (104.3 kg)   SpO2 97%   BMI 25.90 kg/m   Visual Acuity Right Eye Distance:   Left Eye Distance:   Bilateral Distance:    Right Eye Near:   Left Eye Near:    Bilateral Near:     Physical Exam Vitals reviewed.  Constitutional:      General: He is awake. He is not in acute distress.    Appearance: Normal appearance. He is well-developed. He is not ill-appearing, toxic-appearing or diaphoretic.  HENT:     Head: Normocephalic.     Right Ear: Hearing normal.     Left Ear: Hearing normal.     Nose: Nose normal.     Mouth/Throat:     Mouth: Mucous membranes are moist.  Eyes:     General: Vision grossly intact.     Conjunctiva/sclera: Conjunctivae normal.  Cardiovascular:     Rate and Rhythm: Normal rate. Rhythm irregular.     Heart sounds: Normal heart sounds.  Pulmonary:     Effort: Pulmonary effort is normal.     Breath sounds: Normal breath sounds and air entry.     Comments: Respirations even and unlabored Musculoskeletal:        General: Normal range of motion.     Cervical back: Full passive range of motion without pain, normal range of motion and neck supple.     Right lower leg: No edema.     Left lower leg: No edema.  Skin:    General: Skin is warm and dry.     Comments: Multiple lesions are observed above the left eyebrow, on both arms, the left flank, back, and legs. These lesions are hyperpigmented with scabbing, likely resulting from scratching. There is no erythema or drainage noted.   Neurological:     General: No focal deficit present.     Mental Status: He is alert and oriented to person, place, and time.  Psychiatric:        Speech: Speech normal.        Behavior: Behavior is cooperative.      UC Treatments / Results  Labs (all labs ordered are listed, but only abnormal results are displayed) Labs  Reviewed - No data to display  EKG   Radiology No results found.  Procedures ED EKG  Date/Time: 12/24/2023 10:13 AM  Performed by: Iola Lukes, FNP Authorized by: Iola Lukes, FNP   Previous ECG:    Previous ECG:  Compared to current (08/2023)   Similarity:  No change Rate:    ECG rate:  115   ECG rate assessment: tachycardic   Rhythm:    Rhythm: sinus tachycardia   QRS:    QRS axis:  Normal   QRS intervals:  Normal   QRS conduction: normal   ST segments:    ST segments:  Non-specific T waves:    T waves: non-specific    (including critical care time)  Medications Ordered in UC Medications - No data to display  Initial Impression / Assessment and Plan / UC Course  I have reviewed the triage vital signs and the nursing notes.  Pertinent labs & imaging results that were available during my care of the patient were reviewed by me and considered in my medical decision making (see chart for details).    The patient presents with chest tightness and shortness of breath, which began two days ago and is localized to the middle and left side of the chest. The discomfort is described as dull and constant but not severe. The patient has a history of atrial fibrillation and is currently on Xarelto . Additionally, the patient reports lightheadedness for the past 2-3 days. An EKG performed today appears similar to one taken in May. The patient also experienced multiple bee stings incident last Saturday, which may be contributing  to the respiratory symptoms. However, given the patient's cardiac history and current symptoms, further evaluation is necessary to rule out cardiac involvement and assess for a potential systemic reaction to the bee sting. The patient is referred to the Emergency Room for immediate evaluation. The patient is stable for transfer via private vehicle   The patient presents with symptoms and exam findings that warrant a higher level of care and further  evaluation. Given the clinical picture, emergency department (ED) assessment is recommended for advanced diagnostics and management. The patient is currently stable and appropriate for transport via private vehicle. A responsible adult will be driving. The patient is alert, oriented, demonstrates clear mental status, good insight into their illness, and sound judgment in making decisions regarding their care. The risks, rationale, and need for ED evaluation were discussed, and the patient is agreeable to the plan.  Documentation was completed with the aid of voice recognition software. Transcription may contain typographical errors.  Final Clinical Impressions(s) / UC Diagnoses   Final diagnoses:  Chest pain, unspecified type   Discharge Instructions   None    ED Prescriptions   None    PDMP not reviewed this encounter.   Iola Lukes, [place] 12/24/23 1109

## 2023-12-24 NOTE — ED Triage Notes (Signed)
 I was cutting grass last week, I got stung by a bunch of bee's, I have lots of swollen areas, since I have been fighting wether or not I should come to the doctor but starting yesterday started with dizziness (with position), chest pain, sob. I have never been stung like that by so many, maybe an allergy to them.

## 2023-12-24 NOTE — ED Triage Notes (Signed)
 The bee stings were last Saturday, stepped in (in ground) yellow jackets nest. Took Benadryl that day, none since.

## 2023-12-24 NOTE — ED Provider Notes (Signed)
 Care was taken over from Dr. Bernard.  Patient presented with some chest pain and shortness of breath.  He is asymptomatic now.  Does have a little bit of leg swelling.  Chest x-ray shows a little bit of congestive changes but no overt pulmonary edema.  His troponins are minimally elevated but flat.  His proBNP is elevated over 3000.  He is not on Lasix  at home.  He was initially in a sinus rhythm but then went into a flutter and he is currently in a flutter at a controlled rate.  He took an oral dose of his home metoprolol  when he felt himself going into the A-flutter.  He remains in a flutter.  He is on Xarelto .  Discussed with the cardiology fellow, Dr. Duffy.  Given that he is not normally on Lasix  and has not had a recent echo, recommends admission to the hospital service and cardiology will see him tomorrow.  Discussed with Dr. Gonfa who will admit the patient.  Patient was given a dose of IV Lasix .   Lenor Hollering, MD 12/24/23 [phone]

## 2023-12-24 NOTE — ED Triage Notes (Signed)
 Patient reports was stung by yellow jackets last Saturday and since has been having chest tightness and sob.  Patient reports hx of afib and lupus.  Right leg is slightly swollen.

## 2023-12-24 NOTE — H&P (Signed)
 History and Physical    Patient: Gary Lawrence FMW:[id number] DOB: [birth year] DOA: 12/24/2023 DOS: the patient was seen and examined on 12/24/2023 PCP: [name], FNP  Patient coming from: Home.  Lives with his wife.  Dependently ambulates at baseline.  Chief Complaint:  Chief Complaint  Patient presents with   Chest Pain   HPI: Gary Lawrence is a 63 y.o. male with PMH of CAD/remote stent, A-fib/flutter on low-dose Xarelto , oropharyngeal cancer, HTN, GERD, NIDDM-2 and OSA on CPAP presenting with progressive dizziness, chest pain and shortness of breath.  Initially presented to urgent care but sent to ED for evaluation.  Patient was stung by ground hornets last week while working on his yard.  He had a lot of swelling in his skin where he was stung that he managed with Benadryl.  He has had dizziness, palpitation, intermittent chest tightness and shortness of breath since then.  He denies history of CHF but reports history of cardiac stent at [facility] about 6 to 7 years ago and atrial fibrillation/flutter for which he takes Xarelto .  He reports using 4 pillows at at night that is unchanged from baseline.  Also reports chronic lower extremity edema unchanged from baseline.  He reports compliance with metoprolol  and Xarelto .  Since he takes 10 mg of Xarelto  daily.  Last dose of this morning.  He denies fever, chills, runny nose, sore throat, nausea, vomiting abdominal pain.  He had intermittent diarrhea for the last few days but none today.  Denies UTI symptoms.  Denies over-the-counter cold or flu medications recently.  Denies drinking alcohol or excess caffeine.  Denies recreational drug use.  Denies smoking cigarette.  In ED, mildly elevated BP but in a flutter with intermittent RVR to 140.  Creatinine 1.27.  proBNP 3000 (normal  <900).  Troponin 22 and 23.  Twelve-lead EKG showed a flutter at 78 bpm with 421 AV block.  CBC without significant finding.  CXR with new small right basilar  effusion, bibasilar airspace disease suggesting atelectasis and cardiomegaly.  Patient received p.o. Tylenol , IV Lasix , Mylanta and albuterol .  Per EDP, cardiology fellow consulted, recommended admission to medicine and echocardiogram to evaluate patient in the morning.  Review of Systems: As mentioned in the history of present illness. All other systems reviewed and are negative. Past Medical History:  Diagnosis Date   A-fib (HCC)    Cancer (HCC)    Thyroid disease    Past Surgical History:  Procedure Laterality Date   CARDIAC SURGERY     Social History:  reports that he has never smoked. He has never used smokeless tobacco. He reports that he does not drink alcohol and does not use drugs.  Allergies  Allergen Reactions   Other Cough    Unknown to patient   Milk (Cow)     Other reaction(s): VOMITING  Other Reaction(s): GI intolerance    History reviewed. No pertinent family history.  Prior to Admission medications   Medication Sig Start Date End Date Taking? Authorizing Provider  ibuprofen  (ADVIL ) 600 MG tablet Take 1 tablet (600 mg total) by mouth every 6 (six) hours as needed for mild pain (pain score 1-3). 09/12/23  Yes Elnor Hila P, DO  ACCU-CHEK GUIDE test strip USE 1 STRIP ONCE DAILY FOR 90 DAYS 05/30/20   [provider]  amLODipine (NORVASC) 10 MG tablet Take 10 mg by mouth daily. 02/06/20   [provider]  brimonidine  (ALPHAGAN ) 0.15 % ophthalmic solution Apply 1 drop to eye as directed.  09/29/23   [provider]  Cholecalciferol (VITAMIN D3) 50 MCG (2000 UT) TABS Take 2,000 Units by mouth daily.    [provider]  cycloSPORINE (RESTASIS) 0.05 % ophthalmic emulsion Place 1 drop into both eyes 2 (two) times daily.    [provider]  diphenhydrAMINE (BENADRYL) 25 mg capsule Take 25 mg by mouth every 6 (six) hours as needed.    [provider]  ferrous sulfate 324 MG TBEC Take 324 mg by mouth daily.    [provider]  glipiZIDE (GLUCOTROL XL) 2.5 MG 24 hr tablet Take 2.5 mg by mouth daily. 02/22/20   [provider]  loratadine (CLARITIN) 10 MG tablet Take 10 mg by mouth daily.    [provider]  loratadine (CLARITIN) 10 MG tablet Take 10 mg by mouth daily. 09/29/23   [provider]  lovastatin (MEVACOR) 40 MG tablet Take 40 mg by mouth daily. 06/30/23   [provider]  metFORMIN (GLUCOPHAGE) 1000 MG tablet Take 1,000 mg by mouth 2 (two) times daily. 02/22/20   [provider]  methocarbamol  (ROBAXIN ) 500 MG tablet Take 1 tablet (500 mg total) by mouth every 12 (twelve) hours as needed for muscle spasms (muscle pain). 09/12/23   Elnor Hila P, DO  metoprolol  tartrate (LOPRESSOR ) 100 MG tablet Take 100 mg by mouth 2 (two) times daily. 02/22/20   [provider]  rivaroxaban  (XARELTO ) 10 MG TABS tablet Take 10 mg by mouth daily.    [provider]  sildenafil (VIAGRA) 100 MG tablet Take 100 mg by mouth as needed. 09/29/23   [provider]  Tolnaftate  1 % AERO Spray inside shoes weekly Patient not taking: Reported on 09/12/2023 06/23/20   Silva Juliene SAUNDERS, DPM    Physical Exam: Vitals:   12/24/23 1219 12/24/23 1445 12/24/23 1459 12/24/23 1912  BP:  (!) 129/91  (!) 131/96  Pulse:  (!) 58  79  Resp:  15  16  Temp:   98.2 F (36.8 C) 99 F (37.2 C)  TempSrc:   Oral Oral  SpO2: 100% 95%  97%  Weight:      Height:       GENERAL: No apparent distress.  Nontoxic. HEENT: MMM.  Vision and hearing grossly intact.  NECK: Supple.  No apparent JVD.  RESP:  No IWOB.  Fair aeration bilaterally.  No crackles or rales. CVS: Regular rhythm.  HR ranges from 90s to 140s on heart monitor.  Heart sounds normal.  ABD/GI/GU: BS+. Abd soft, NTND.  MSK/EXT:   No apparent deformity. Moves extremities.  1+ BLE edema. SKIN: no apparent skin lesion or wound NEURO: Awake and alert. Oriented appropriately.  No apparent focal neuro  deficit. PSYCH: Calm. Normal affect.  Data Reviewed: See HPI  Assessment and Plan: Atrial flutter with intermittent RVR: Patient presents with intermittent dizziness, palpitation, chest tightness shortness of breath for about a week since he was stung by ground hornet.  No history of CHF but history of CAD and atrial flutter.  He is on metoprolol  twice daily and as needed, low-dose Xarelto .  Last dose of Xarelto  this morning.  He is heart rate fluctuates from 80s to 140s.  proBNP slightly elevated.  CXR with small new bibasilar pleural effusion, cardiomegaly and bibasilar airspace positive.  Received IV Lasix  and had excellent urine output. - Continue IV Lasix  40 mg daily - Continue home metoprolol  100 mg twice daily starting tonight - Will dose Xarelto  per pharmacy.  I  think he needs 20 mg daily instead of 10 - Optimize electrolyte - Echocardiogram - Per EDP, cardiology to see patient in the morning  Acute CHF: Unknown type CHF.  No prior echocardiogram.  Not on diuretics at home.  Presentation and findings as above.  He has chronic 4 pillow orthopnea and BLE edema unchanged from baseline. - Diuretics as above - Echocardiogram - Strict intake and output, daily weight, renal functions and electrolytes  Chest pain/dyspnea/lightheadedness: Likely due to a flutter with RVR and CHF.  Doubt ACS.  Mildly elevated troponin without significant delta.  EKG without acute ischemic review. -Echocardiogram as above -Manage A-flutter and CHF as above  History of CAD/remote stent - Continue home metoprolol  and statin.  NIDDM-2 with hyperglycemia -Check hemoglobin A1c -SSI-sensitive  OSA on CPAP - Nightly CPAP  GERD? Does not seem to medication.  Diarrhea: Seems of resolved.  Abdominal exam and   Advance Care Planning:   Code Status: Full Code-discussed with patient and patient's wife  Consults: Cardiology  Family Communication: Patient's wife was on FaceTime during this  encounter  Severity of Illness: The appropriate patient status for this patient is OBSERVATION. Observation status is judged to be reasonable and necessary in order to provide the required intensity of service to ensure the patient's safety. The patient's presenting symptoms, physical exam findings, and initial radiographic and laboratory data in the context of their medical condition is felt to place them at decreased risk for further clinical deterioration. Furthermore, it is anticipated that the patient will be medically stable for discharge from the hospital within 2 midnights of admission.   Author: Mignon ONEIDA Bump, MD 12/24/2023 9:06 PM  For on call review [email or web address].

## 2023-12-24 NOTE — Discharge Instructions (Signed)
 It was our pleasure to provide your ER care today - we hope that you feel better.  If GI/reflux symptoms, try taking pepcid and maalox as need for symptom relief.   For recent chest pain, follow up closely with cardiologist in the next 1-2 weeks - we made referral, and they should be contacting you with an appointment in the next few days.   Return to ER right away if worse, new symptoms, fevers, recurrent/persistent chest pain, increased trouble breathing, or other concern.

## 2023-12-24 NOTE — ED Provider Notes (Signed)
 [place] EMERGENCY DEPARTMENT AT [facility] Provider Note   CSN: [id number] Arrival date & time: 12/24/23  1058     Patient presents with: Chest Pain   Gary Lawrence is a 63 y.o. male.   Pt indicates a week ago had several bee stings - indicates was concerned if possibly had a reaction/delayed rxn as in past few days had noticed some chest tightness. Symptoms at rest, comes and goes, without specific exacerbating or alleviating factors. At times feels as if wheezing. No hx asthma/copd, remote smoking hx.  No exertional chest pain or discomfort. No constant and/or pleuritic chest pain. No associated nv, diaphoresis, palpitations or sob. No leg pain or swelling. No hx dvt or pe. Is on xarelto , hx afib. No indigestion. No new or worsening cough or uri symptoms. No fever or chills. Went to urgent care and was told to go to ER.   The history is provided by the patient.  Chest Pain Associated symptoms: no abdominal pain, no back pain, no cough, no dysphagia, no fever, no headache, no palpitations, no shortness of breath and no vomiting        Prior to Admission medications   Medication Sig Start Date End Date Taking? Authorizing Provider  ACCU-CHEK GUIDE test strip USE 1 STRIP ONCE DAILY FOR 90 DAYS 05/30/20   [provider]  amLODipine (NORVASC) 10 MG tablet Take 10 mg by mouth daily. 02/06/20   [provider]  brimonidine  (ALPHAGAN ) 0.15 % ophthalmic solution Apply 1 drop to eye as directed. 09/29/23   [provider]  Cholecalciferol (VITAMIN D3) 50 MCG (2000 UT) TABS Take 2,000 Units by mouth daily.    [provider]  cycloSPORINE (RESTASIS) 0.05 % ophthalmic emulsion Place 1 drop into both eyes 2 (two) times daily.    [provider]  diphenhydrAMINE (BENADRYL) 25 mg capsule Take 25 mg by mouth every 6 (six) hours as needed.    [provider]  ferrous sulfate 324 MG TBEC Take 324 mg by mouth daily.    [provider]  glipiZIDE (GLUCOTROL XL) 2.5 MG 24 hr tablet Take 2.5 mg by mouth daily. 02/22/20   [provider]  ibuprofen  (ADVIL ) 600 MG tablet Take 1 tablet (600 mg total) by mouth every 6 (six) hours as needed for mild pain (pain score 1-3). 09/12/23   Elnor Hila P, DO  loratadine (CLARITIN) 10 MG tablet Take 10 mg by mouth daily.    [provider]  loratadine (CLARITIN) 10 MG tablet Take 10 mg by mouth daily. 09/29/23   [provider]  lovastatin (MEVACOR) 40 MG tablet Take 40 mg by mouth daily. 06/30/23   [provider]  metFORMIN (GLUCOPHAGE) 1000 MG tablet Take 1,000 mg by mouth 2 (two) times daily. 02/22/20   [provider]  methocarbamol  (ROBAXIN ) 500 MG tablet Take 1 tablet (500 mg total) by mouth every 12 (twelve) hours as needed for muscle spasms (muscle pain). 09/12/23   Elnor Hila P, DO  metoprolol  tartrate (LOPRESSOR ) 100 MG tablet Take 100 mg by mouth 2 (two) times daily. 02/22/20   [provider]  rivaroxaban  (XARELTO ) 10 MG TABS tablet Take 10 mg by mouth daily.    [provider]  sildenafil (VIAGRA) 100 MG tablet Take 100 mg by mouth as needed. 09/29/23   [provider]  Tolnaftate  1 % AERO Spray inside shoes weekly Patient not taking: Reported on 09/12/2023 06/23/20   Silva Juliene SAUNDERS, DPM  Allergies: Other and Milk (cow)    Review of Systems  Constitutional:  Negative for chills and fever.  HENT:  Negative for sore throat and trouble swallowing.   Respiratory:  Negative for cough and shortness of breath.   Cardiovascular:  Positive for chest pain. Negative for palpitations and leg swelling.  Gastrointestinal:  Negative for abdominal pain, diarrhea and vomiting.  Genitourinary:  Negative for dysuria and flank pain.  Musculoskeletal:  Negative for back pain and neck pain.  Skin:  Negative for rash.  Neurological:  Negative for syncope and headaches.    Updated Vital Signs BP (!) 129/91    Pulse (!) 58   Temp 98.2 F (36.8 C) (Oral)   Resp 15   Ht 2.007 m (6' 7)   Wt 103.9 kg   SpO2 95%   BMI 25.80 kg/m   Physical Exam Vitals and nursing note reviewed.  Constitutional:      Appearance: Normal appearance. He is well-developed.  HENT:     Head: Atraumatic.     Nose: Nose normal.     Mouth/Throat:     Mouth: Mucous membranes are moist.     Pharynx: Oropharynx is clear. No oropharyngeal exudate or posterior oropharyngeal erythema.  Eyes:     General: No scleral icterus.    Conjunctiva/sclera: Conjunctivae normal.  Neck:     Trachea: No tracheal deviation.  Cardiovascular:     Rate and Rhythm: Normal rate and regular rhythm.     Pulses: Normal pulses.     Heart sounds: Normal heart sounds. No murmur heard.    No friction rub. No gallop.  Pulmonary:     Effort: Pulmonary effort is normal. No accessory muscle usage or respiratory distress.     Breath sounds: Normal breath sounds.  Abdominal:     General: Bowel sounds are normal. There is no distension.     Palpations: Abdomen is soft.     Tenderness: There is no abdominal tenderness. There is no guarding.  Genitourinary:    Comments: No cva tenderness. Musculoskeletal:        General: No swelling or tenderness.     Cervical back: Normal range of motion and neck supple. No rigidity.     Right lower leg: No edema.     Left lower leg: No edema.  Skin:    General: Skin is warm and dry.     Findings: No rash.     Comments: Few small marks to face - pt indicates prior sting area. On skin exam, no fbs noted, no infected sting areas, cellulitis or open wounds or ulcerations noted.   Neurological:     Mental Status: He is alert.     Comments: Alert, speech clear.   Psychiatric:        Mood and Affect: Mood normal.     (all labs ordered are listed, but only abnormal results are displayed) Results for orders placed or performed during the hospital encounter of 12/24/23  CBC with Differential   Collection Time:  12/24/23 11:28 AM  Result Value Ref Range   WBC 4.1 4.0 - 10.5 K/uL   RBC 4.66 4.22 - 5.81 MIL/uL   Hemoglobin 12.5 (L) 13.0 - 17.0 g/dL   HCT 59.8 60.9 - 47.9 %   MCV 86.1 80.0 - 100.0 fL   MCH 26.8 26.0 - 34.0 pg   MCHC 31.2 30.0 - 36.0 g/dL   RDW 86.1 88.4 - 84.4 %   Platelets 249 150 - 400 K/uL  nRBC 0.0 0.0 - 0.2 %   Neutrophils Relative % 60 %   Neutro Abs 2.4 1.7 - 7.7 K/uL   Lymphocytes Relative 24 %   Lymphs Abs 1.0 0.7 - 4.0 K/uL   Monocytes Relative 12 %   Monocytes Absolute 0.5 0.1 - 1.0 K/uL   Eosinophils Relative 3 %   Eosinophils Absolute 0.1 0.0 - 0.5 K/uL   Basophils Relative 1 %   Basophils Absolute 0.0 0.0 - 0.1 K/uL   Immature Granulocytes 0 %   Abs Immature Granulocytes 0.01 0.00 - 0.07 K/uL  Basic metabolic panel   Collection Time: 12/24/23 11:28 AM  Result Value Ref Range   Sodium 142 135 - 145 mmol/L   Potassium 4.4 3.5 - 5.1 mmol/L   Chloride 107 98 - 111 mmol/L   CO2 25 22 - 32 mmol/L   Glucose, Bld 132 (H) 70 - 99 mg/dL   BUN 18 8 - 23 mg/dL   Creatinine, Ser 8.72 (H) 0.61 - 1.24 mg/dL   Calcium  9.6 8.9 - 10.3 mg/dL   GFR, Estimated >39 >39 mL/min   Anion gap 10 5 - 15  Troponin T, High Sensitivity   Collection Time: 12/24/23 11:28 AM  Result Value Ref Range   Troponin T High Sensitivity 22 (H) 0 - 19 ng/L   DG Chest 2 View Result Date: 12/24/2023 EXAM: 2 VIEW(S) XRAY OF THE CHEST 12/24/2023 11:40:00 AM COMPARISON: 2 view chest x-ray 09/12/2023. CLINICAL HISTORY: SOB. FINDINGS: LUNGS AND PLEURA: Bibasilar airspace disease likely reflects atelectasis. Infection is not excluded. New small bilateral pleural effusions are present. HEART AND MEDIASTINUM: The heart is enlarged. BONES AND SOFT TISSUES: No acute osseous abnormality. IMPRESSION: 1. New small bilateral pleural effusions. 2. Bibasilar airspace disease, likely atelectasis. Infection not excluded. 3. Enlarged heart. Electronically signed by: Lonni Necessary MD 12/24/2023 11:45 AM EDT RP  Workstation: [id number]     EKG: EKG Interpretation Date/Time:  Saturday December 24 2023 11:07:33 EDT Ventricular Rate:  68 PR Interval:  165 QRS Duration:  93 QT Interval:  423 QTC Calculation: 450 R Axis:   60  Text Interpretation: Sinus rhythm Non-specific ST-t changes Confirmed by Bernard Drivers ([id number]) on 12/24/2023 11:17:52 AM  Radiology: DG Chest 2 View Result Date: 12/24/2023 EXAM: 2 VIEW(S) XRAY OF THE CHEST 12/24/2023 11:40:00 AM COMPARISON: 2 view chest x-ray 09/12/2023. CLINICAL HISTORY: SOB. FINDINGS: LUNGS AND PLEURA: Bibasilar airspace disease likely reflects atelectasis. Infection is not excluded. New small bilateral pleural effusions are present. HEART AND MEDIASTINUM: The heart is enlarged. BONES AND SOFT TISSUES: No acute osseous abnormality. IMPRESSION: 1. New small bilateral pleural effusions. 2. Bibasilar airspace disease, likely atelectasis. Infection not excluded. 3. Enlarged heart. Electronically signed by: Lonni Necessary MD 12/24/2023 11:45 AM EDT RP Workstation: [id number]     Procedures   Medications Ordered in the ED  alum & mag hydroxide-simeth (MAALOX/MYLANTA) 200-200-20 MG/5ML suspension 30 mL (30 mLs Oral Given 12/24/23 1248)  famotidine  (PEPCID ) tablet 20 mg (20 mg Oral Given 12/24/23 1251)  acetaminophen  (TYLENOL ) tablet 1,000 mg (1,000 mg Oral Given 12/24/23 1250)  albuterol  (PROVENTIL ) (2.5 MG/3ML) 0.083% nebulizer solution 5 mg (5 mg Nebulization Given 12/24/23 1255)                                    Medical Decision Making Problems Addressed: Precordial chest pain: acute illness or injury with systemic symptoms that poses a threat to life  or bodily functions  Amount and/or Complexity of Data Reviewed External Data Reviewed: notes. Labs: ordered. Decision-making details documented in ED Course. Radiology: ordered and independent interpretation performed. Decision-making details documented in ED Course. ECG/medicine tests: ordered and  independent interpretation performed. Decision-making details documented in ED Course.  Risk OTC drugs. Prescription drug management. Decision regarding hospitalization.   Iv ns. Continuous pulse ox and cardiac monitoring. Labs ordered/sent. Imaging ordered.   Differential diagnosis includes acs, msk cp, gi cp, etc. Dispo decision including potential need for admission considered - will get labs and imaging and reassess.   Reviewed nursing notes and prior charts for additional history. External reports reviewed.  Meds given for symptom relief.   Cardiac monitor: sinus rhythm, rate 70.  Labs reviewed/interpreted by me - wbc and hct normal.   Xrays reviewed/interpreted by me - ?atelectasis, no def pna.   1525, labs pending - signed out to oncoming EDP Dr Lenor to check pending labs, recheck and dispo appropriately.         Final diagnoses:  Precordial chest pain    ED Discharge Orders     None          Bernard Drivers, MD 12/24/23 1526

## 2023-12-24 NOTE — ED Notes (Signed)
 Patient is being discharged from the Urgent Care and sent to the Emergency Department via POV . Per CANDIE Sarin, FNP, patient is in need of higher level of care due to further cardiac workup. Patient is aware and verbalizes understanding of plan of care.  Vitals:   12/24/23 0913 12/24/23 0958  BP: (!) 150/101 (!) 128/93  Pulse: (!) 110 80  Resp: (!) 24   Temp: 97.9 F (36.6 C)   SpO2: 97%

## 2023-12-25 ENCOUNTER — Observation Stay (HOSPITAL_COMMUNITY)

## 2023-12-25 ENCOUNTER — Encounter (HOSPITAL_COMMUNITY): Payer: Self-pay | Admitting: Student

## 2023-12-25 DIAGNOSIS — I2510 Atherosclerotic heart disease of native coronary artery without angina pectoris: Secondary | ICD-10-CM | POA: Diagnosis present

## 2023-12-25 DIAGNOSIS — I4819 Other persistent atrial fibrillation: Secondary | ICD-10-CM | POA: Diagnosis not present

## 2023-12-25 DIAGNOSIS — I483 Typical atrial flutter: Secondary | ICD-10-CM

## 2023-12-25 DIAGNOSIS — I428 Other cardiomyopathies: Secondary | ICD-10-CM | POA: Diagnosis present

## 2023-12-25 DIAGNOSIS — R072 Precordial pain: Secondary | ICD-10-CM | POA: Diagnosis present

## 2023-12-25 DIAGNOSIS — I5020 Unspecified systolic (congestive) heart failure: Secondary | ICD-10-CM | POA: Diagnosis not present

## 2023-12-25 DIAGNOSIS — Z7901 Long term (current) use of anticoagulants: Secondary | ICD-10-CM | POA: Diagnosis not present

## 2023-12-25 DIAGNOSIS — N1831 Chronic kidney disease, stage 3a: Secondary | ICD-10-CM | POA: Diagnosis not present

## 2023-12-25 DIAGNOSIS — K219 Gastro-esophageal reflux disease without esophagitis: Secondary | ICD-10-CM | POA: Diagnosis present

## 2023-12-25 DIAGNOSIS — I480 Paroxysmal atrial fibrillation: Secondary | ICD-10-CM | POA: Diagnosis present

## 2023-12-25 DIAGNOSIS — R079 Chest pain, unspecified: Secondary | ICD-10-CM

## 2023-12-25 DIAGNOSIS — I441 Atrioventricular block, second degree: Secondary | ICD-10-CM | POA: Diagnosis present

## 2023-12-25 DIAGNOSIS — E1122 Type 2 diabetes mellitus with diabetic chronic kidney disease: Secondary | ICD-10-CM | POA: Diagnosis present

## 2023-12-25 DIAGNOSIS — N1832 Chronic kidney disease, stage 3b: Secondary | ICD-10-CM | POA: Diagnosis present

## 2023-12-25 DIAGNOSIS — E785 Hyperlipidemia, unspecified: Secondary | ICD-10-CM | POA: Diagnosis present

## 2023-12-25 DIAGNOSIS — Z85818 Personal history of malignant neoplasm of other sites of lip, oral cavity, and pharynx: Secondary | ICD-10-CM | POA: Diagnosis not present

## 2023-12-25 DIAGNOSIS — I5021 Acute systolic (congestive) heart failure: Secondary | ICD-10-CM | POA: Diagnosis present

## 2023-12-25 DIAGNOSIS — Z91011 Allergy to milk products: Secondary | ICD-10-CM | POA: Diagnosis not present

## 2023-12-25 DIAGNOSIS — E1165 Type 2 diabetes mellitus with hyperglycemia: Secondary | ICD-10-CM | POA: Diagnosis present

## 2023-12-25 DIAGNOSIS — R7989 Other specified abnormal findings of blood chemistry: Secondary | ICD-10-CM

## 2023-12-25 DIAGNOSIS — I130 Hypertensive heart and chronic kidney disease with heart failure and stage 1 through stage 4 chronic kidney disease, or unspecified chronic kidney disease: Secondary | ICD-10-CM | POA: Diagnosis present

## 2023-12-25 DIAGNOSIS — I4892 Unspecified atrial flutter: Secondary | ICD-10-CM | POA: Diagnosis present

## 2023-12-25 DIAGNOSIS — I4510 Unspecified right bundle-branch block: Secondary | ICD-10-CM | POA: Diagnosis present

## 2023-12-25 DIAGNOSIS — Z955 Presence of coronary angioplasty implant and graft: Secondary | ICD-10-CM | POA: Diagnosis not present

## 2023-12-25 DIAGNOSIS — E119 Type 2 diabetes mellitus without complications: Secondary | ICD-10-CM

## 2023-12-25 DIAGNOSIS — G4733 Obstructive sleep apnea (adult) (pediatric): Secondary | ICD-10-CM | POA: Diagnosis present

## 2023-12-25 DIAGNOSIS — R000 Tachycardia, unspecified: Secondary | ICD-10-CM

## 2023-12-25 DIAGNOSIS — Z7984 Long term (current) use of oral hypoglycemic drugs: Secondary | ICD-10-CM | POA: Diagnosis not present

## 2023-12-25 DIAGNOSIS — I509 Heart failure, unspecified: Secondary | ICD-10-CM | POA: Diagnosis not present

## 2023-12-25 DIAGNOSIS — Z79899 Other long term (current) drug therapy: Secondary | ICD-10-CM | POA: Diagnosis not present

## 2023-12-25 LAB — CBC
HCT: 38.5 % — ABNORMAL LOW (ref 39.0–52.0)
Hemoglobin: 12.6 g/dL — ABNORMAL LOW (ref 13.0–17.0)
MCH: 27.6 pg (ref 26.0–34.0)
MCHC: 32.7 g/dL (ref 30.0–36.0)
MCV: 84.2 fL (ref 80.0–100.0)
Platelets: 281 K/uL (ref 150–400)
RBC: 4.57 MIL/uL (ref 4.22–5.81)
RDW: 13.7 % (ref 11.5–15.5)
WBC: 4.9 K/uL (ref 4.0–10.5)
nRBC: 0 % (ref 0.0–0.2)

## 2023-12-25 LAB — MAGNESIUM
Magnesium: 1.5 mg/dL — ABNORMAL LOW (ref 1.7–2.4)
Magnesium: 1.2 mg/dL — ABNORMAL LOW (ref 1.7–2.4)

## 2023-12-25 LAB — ECHOCARDIOGRAM COMPLETE
AR max vel: 2.64 cm2
AV Area VTI: 2.43 cm2
AV Area mean vel: 2.63 cm2
AV Mean grad: 2 mmHg
AV Peak grad: 3.7 mmHg
Ao pk vel: 0.96 m/s
Area-P 1/2: 5.84 cm2
Height: 79 in
S' Lateral: 4.82 cm
Weight: 3664 [oz_av]

## 2023-12-25 LAB — GLUCOSE, CAPILLARY
Glucose-Capillary: 237 mg/dL — ABNORMAL HIGH (ref 70–99)
Glucose-Capillary: 73 mg/dL (ref 70–99)
Glucose-Capillary: 137 mg/dL — ABNORMAL HIGH (ref 70–99)

## 2023-12-25 LAB — BASIC METABOLIC PANEL WITH GFR
Anion gap: 11 (ref 5–15)
BUN: 19 mg/dL (ref 8–23)
CO2: 28 mmol/L (ref 22–32)
Calcium: 10.1 mg/dL (ref 8.9–10.3)
Chloride: 104 mmol/L (ref 98–111)
Creatinine, Ser: 1.42 mg/dL — ABNORMAL HIGH (ref 0.61–1.24)
GFR, Estimated: 55 mL/min — ABNORMAL LOW (ref >60)
Glucose, Bld: 102 mg/dL — ABNORMAL HIGH (ref 70–99)
Potassium: 4 mmol/L (ref 3.5–5.1)
Sodium: 143 mmol/L (ref 135–145)

## 2023-12-25 LAB — APTT
aPTT: 38 s — ABNORMAL HIGH (ref 24–36)
aPTT: 63 s — ABNORMAL HIGH (ref 24–36)

## 2023-12-25 LAB — CBG MONITORING, ED: Glucose-Capillary: 107 mg/dL — ABNORMAL HIGH (ref 70–99)

## 2023-12-25 LAB — HIV ANTIBODY (ROUTINE TESTING W REFLEX): HIV Screen 4th Generation wRfx: NONREACTIVE

## 2023-12-25 LAB — HEPARIN LEVEL (UNFRACTIONATED): Heparin Unfractionated: >1.1 [IU]/mL — ABNORMAL HIGH (ref 0.30–0.70)

## 2023-12-25 MED ORDER — PANTOPRAZOLE SODIUM 40 MG PO TBEC
40.0000 mg | DELAYED_RELEASE_TABLET | Freq: Every day | ORAL | Status: DC
  Administered 2023-12-25 – 2023-12-28 (×4): 40 mg via ORAL
  Filled 2023-12-25 (×4): qty 1

## 2023-12-25 MED ORDER — BRIMONIDINE TARTRATE 0.15 % OP SOLN
1.0000 [drp] | Freq: Three times a day (TID) | OPHTHALMIC | Status: DC
  Administered 2023-12-25 – 2023-12-28 (×8): 1 [drp] via OPHTHALMIC
  Filled 2023-12-25 (×2): qty 5

## 2023-12-25 MED ORDER — MAGNESIUM SULFATE 4 GM/100ML IV SOLN
4.0000 g | Freq: Two times a day (BID) | INTRAVENOUS | Status: AC
  Administered 2023-12-25 (×2): 4 g via INTRAVENOUS
  Filled 2023-12-25 (×2): qty 100

## 2023-12-25 MED ORDER — HEPARIN (PORCINE) 25000 UT/250ML-% IV SOLN
1400.0000 [IU]/h | INTRAVENOUS | Status: DC
  Administered 2023-12-25: 1450 [IU]/h via INTRAVENOUS
  Administered 2023-12-26: 1400 [IU]/h via INTRAVENOUS
  Filled 2023-12-25 (×3): qty 250

## 2023-12-25 MED ORDER — ATORVASTATIN CALCIUM 40 MG PO TABS
40.0000 mg | ORAL_TABLET | Freq: Every day | ORAL | Status: DC
  Administered 2023-12-25 – 2023-12-28 (×4): 40 mg via ORAL
  Filled 2023-12-25 (×4): qty 1

## 2023-12-25 NOTE — Progress Notes (Signed)
 [facility] PROGRESS NOTE   Gary Lawrence FMW:[id number] DOB: 05-Aug-1960 DOA: 12/24/2023  PCP: [name], FNP  Brief History: 63 y.o. male with PMH of CAD/remote stent, A-fib/flutter on low-dose Xarelto , oropharyngeal cancer, HTN, GERD, NIDDM-2 and OSA on CPAP presenting with progressive dizziness, chest pain and shortness of breath.  Initially presented to urgent care but sent to ED for evaluation.  Patient was stung by hornets a week prior to admission.  Ever since then he has been feeling poorly.  Also developed shortness of breath.  In the emergency department he was noted to be in atrial flutter with RVR.  BNP was noted to be elevated.  He was hospitalized for further management.  Consultants: Cardiology  Procedures: Echocardiogram is pending    Subjective/Interval History: Patient complains of shortness of breath but better than yesterday.  Denies any chest pain at this time.  No nausea or vomiting.  No headaches.    Assessment/Plan:  Atrial flutter with RVR He has longstanding history of atrial flutter.  He is on metoprolol  as well as Xarelto .  These were resumed.  Current RVR may have been induced by recent hornet stings. Heart rate seems to be poorly controlled. Seen by cardiology.  He has been transition from Xarelto  to IV heparin .  He may need further cardiac workup with possible cardiac catheterization.  He will need to be transferred to [facility] for same. Continue metoprolol  for now.  Monitor telemetry. Echocardiogram is pending. Check TSH.  Acute CHF, unspecified Echocardiogram is pending to determine the type of CHF. He was given furosemide  yesterday with good diuresis.  Currently on once daily furosemide .  Creatinine noted to be slightly higher today.  Recheck labs tomorrow morning. Strict ins and outs and daily weights. Chest x-ray showed bilateral small pleural effusions.  Chest pain dyspnea lightheadedness Most likely due to atrial  flutter with RVR.  Troponins were only mildly elevated.  CHF also contributing. Xarelto  has been transitioned to heparin  by cardiology.  Consideration is being given to cardiac catheterization during this admission.  History of coronary artery disease Remote stent in the past.  Continue metoprolol  and statin.  Diabetes mellitus type 2, controlled HbA1c is pending.  Monitor CBGs.  He is just on oral medications prior to admission.  Currently on SSI.  Obstructive sleep apnea CPAP at nighttime.  DVT Prophylaxis: IV heparin  Code Status: Full code Family Communication: Discussed with patient Disposition Plan: Transfer to [facility]  Status is: Observation The patient will require care spanning > 2 midnights and should be moved to inpatient because: Cardiac workup      Medications: Scheduled:  furosemide   40 mg Intravenous Daily   insulin  aspart  0-5 Units Subcutaneous QHS   insulin  aspart  0-9 Units Subcutaneous TID WC   metoprolol  tartrate  100 mg Oral BID   Continuous:  heparin      PRN:acetaminophen  **OR** acetaminophen , metoprolol  tartrate  Antibiotics: Anti-infectives (From admission, onward)    None       Objective:  Vital Signs  Vitals:   12/25/23 0330 12/25/23 0430 12/25/23 0643 12/25/23 0733  BP: 138/88 (!) 151/98  131/87  Pulse: 64 (!) 104  72  Resp: 10 13  17   Temp:   98.1 F (36.7 C) (!) 97.4 F (36.3 C)  TempSrc:   Oral Oral  SpO2: 98% 96%  97%  Weight:      Height:       No intake or output data in the  24 hours ending 12/25/23 0917 Filed Weights   12/24/23 1106  Weight: 103.9 kg    General appearance: Awake alert.  In no distress Resp: No effort at rest.  Few crackles at the bases.  No wheezing or rhonchi. Cardio: S1-S2 is irregularly irregular. GI: Abdomen is soft.  Nontender nondistended.  Bowel sounds are present normal.  No masses organomegaly Extremities: Mild edema bilateral lower extremities. Neurologic: Alert and  oriented x3.  No focal neurological deficits.    Lab Results:  Data Reviewed: I have personally reviewed following labs and reports of the imaging studies  CBC: Recent Labs  Lab 12/24/23 1128 12/25/23 0437  WBC 4.1 4.9  NEUTROABS 2.4  --   HGB 12.5* 12.6*  HCT 40.1 38.5*  MCV 86.1 84.2  PLT 249 281    Basic Metabolic Panel: Recent Labs  Lab 12/24/23 1128 12/25/23 0437  NA 142 143  K 4.4 4.0  CL 107 104  CO2 25 28  GLUCOSE 132* 102*  BUN 18 19  CREATININE 1.27* 1.42*  CALCIUM  9.6 10.1    GFR: Estimated Creatinine Clearance: 68.7 mL/min (A) (by C-G formula based on SCr of 1.42 mg/dL (H)).  BNP (last 3 results) Recent Labs    12/24/23 1507  PROBNP 3,163.0*    CBG: Recent Labs  Lab 12/24/23 2134 12/25/23 0736  GLUCAP 94 107*    Radiology Studies: DG Chest 2 View Result Date: 12/24/2023 EXAM: 2 VIEW(S) XRAY OF THE CHEST 12/24/2023 11:40:00 AM COMPARISON: 2 view chest x-ray 09/12/2023. CLINICAL HISTORY: SOB. FINDINGS: LUNGS AND PLEURA: Bibasilar airspace disease likely reflects atelectasis. Infection is not excluded. New small bilateral pleural effusions are present. HEART AND MEDIASTINUM: The heart is enlarged. BONES AND SOFT TISSUES: No acute osseous abnormality. IMPRESSION: 1. New small bilateral pleural effusions. 2. Bibasilar airspace disease, likely atelectasis. Infection not excluded. 3. Enlarged heart. Electronically signed by: Lonni Necessary MD 12/24/2023 11:45 AM EDT RP Workstation: [id number]       LOS: 0 days   Gary Lawrence  [facility] Pager on [email or web address]  12/25/2023, 9:17 AM

## 2023-12-25 NOTE — Consult Note (Signed)
 Cardiology Consultation:   Patient ID: Gary Lawrence; [id number]; Dec 26, 1960   Admit date: 12/24/2023 Date of Consult: 12/25/2023  Primary Care Provider: [name], FNP Primary Cardiologist: None  Primary Electrophysiologist:  None   Patient Profile:   Gary Lawrence is a 63 y.o. male with a hx of atrial fibrillation who is being seen today for the evaluation of chest pain at the request of Dr. Verdene.   History of Present Illness:   Gary Lawrence has a history of atrial fibrillation/flutter seen at [facility].  He is status post ablation.  He has been on Xarelto .  He has sleep apnea and uses CPAP.  Other cardiac workup includes nuclear stress testing in 2021 with a small mild severity defect in the apical and apical septal segments.  It was thought to be artifact.  The EF is 53%.    He presented to the emergency room yesterday with dizziness and palpitations and intermittent chest tightness.  He thought this might have been related to a bee sting he had last week with no reaction.  In the ED he had a wide-complex tachycardia that broke and likely represented aberrant right bundle branch block morphology conduction with 2 1 AV block and atrial flutter.  In the ED proBNP was 3163.  Troponin was minimally elevated and flat at 22 and 23.  Chest x-ray demonstrated small right basilar effusion, bibasilar airspace disease.  He is married but lives with his landlord's son.  He is active doing a couple of physical jobs.  He has not had any significant limitations with this.  He says that his ablation never really worked.  He has felt fibrillation off and on every day.  However, he presented because 2 to 3 days ago he started having more dizziness and palpitations.  He had more exercise intolerance.  He did describe some chest discomfort which was 4 out of 5 in intensity and somewhat constant.  This was new for him.  He was not describing neck or jaw discomfort.  He was having what sounds like  PND and orthopnea.  He does sleep with his CPAP.  He has had to prop up and the bed to catch his breath however.  Of note he describes his EP ablation but he also describes stents.  I do not see this in any of his notes and I do not think he is describing coronary artery disease.  Past Medical History:  Diagnosis Date   A-fib [facility])    Status post ablation   Cancer (HCC)    Tonsils   Thyroid disease     Past Surgical History:  Procedure Laterality Date   NECK SURGERY     Neck dissection     Home Medications:  Prior to Admission medications   Medication Sig Start Date End Date Taking? Authorizing Provider  amLODipine (NORVASC) 5 MG tablet Take 5 mg by mouth daily.   Yes [provider]  chlorthalidone (HYGROTON) 25 MG tablet Take 25 mg by mouth daily.   Yes [provider]  Cholecalciferol (VITAMIN D3) 50 MCG (2000 UT) TABS Take 2,000 Units by mouth daily.   Yes [provider]  cycloSPORINE (RESTASIS) 0.05 % ophthalmic emulsion Place 1 drop into both eyes 2 (two) times daily.   Yes [provider]  diphenhydrAMINE (BENADRYL) 25 mg capsule Take 25 mg by mouth every 6 (six) hours as needed.   Yes [provider]  enalapril (VASOTEC) 10 MG tablet Take 10 mg by  mouth daily.   Yes [provider]  fenofibrate (TRICOR) 48 MG tablet Take 48 mg by mouth daily.   Yes [provider]  ferrous sulfate 324 MG TBEC Take 324 mg by mouth daily.   Yes [provider]  fluticasone (FLONASE) 50 MCG/ACT nasal spray Place 1 spray into both nostrils daily.   Yes [provider]  glipiZIDE (GLUCOTROL XL) 2.5 MG 24 hr tablet Take 2.5 mg by mouth daily with breakfast.   Yes [provider]  ibuprofen  (ADVIL ) 600 MG tablet Take 1 tablet (600 mg total) by mouth every 6 (six) hours as needed for mild pain (pain score 1-3). 09/12/23  Yes Elnor Hila P, DO  loratadine (CLARITIN) 10 MG tablet Take 10 mg by mouth daily.   Yes  [provider]  lovastatin (MEVACOR) 40 MG tablet Take 40 mg by mouth daily. 06/30/23  Yes [provider]  metFORMIN (GLUCOPHAGE) 1000 MG tablet Take 1,000 mg by mouth 2 (two) times daily. 02/22/20  Yes [provider]  metoprolol  tartrate (LOPRESSOR ) 100 MG tablet Take 100 mg by mouth 2 (two) times daily. 02/22/20  Yes [provider]  pantoprazole  (PROTONIX ) 40 MG tablet Take 40 mg by mouth daily.   Yes [provider]  rivaroxaban  (XARELTO ) 10 MG TABS tablet Take 10 mg by mouth daily.   Yes [provider]  sildenafil (VIAGRA) 100 MG tablet Take 100 mg by mouth as needed. 09/29/23  Yes [provider]  ACCU-CHEK GUIDE test strip USE 1 STRIP ONCE DAILY FOR 90 DAYS 05/30/20   [provider]  brimonidine  (ALPHAGAN ) 0.15 % ophthalmic solution Apply 1 drop to eye as directed. 09/29/23   [provider]  methocarbamol  (ROBAXIN ) 500 MG tablet Take 1 tablet (500 mg total) by mouth every 12 (twelve) hours as needed for muscle spasms (muscle pain). Patient not taking: Reported on 12/24/2023 09/12/23   Elnor Hila P, DO  Tolnaftate  1 % AERO Spray inside shoes weekly Patient not taking: Reported on 09/12/2023 06/23/20   McDonald, Adam R, DPM    Inpatient Medications: Scheduled Meds:  furosemide   40 mg Intravenous Daily   insulin  aspart  0-5 Units Subcutaneous QHS   insulin  aspart  0-9 Units Subcutaneous TID WC   metoprolol  tartrate  100 mg Oral BID   rivaroxaban   20 mg Oral Daily   Continuous Infusions:  PRN Meds: acetaminophen  **OR** acetaminophen , metoprolol  tartrate  Allergies:    Allergies  Allergen Reactions   Other Cough    Unknown to patient   Milk (Cow)     Other reaction(s): VOMITING  Other Reaction(s): GI intolerance    Social History:   Social History   Socioeconomic History   Marital status: Married    Spouse name: Not on file   Number of children: Not on file   Years of education: Not on file    Highest education level: Not on file  Occupational History   Not on file  Tobacco Use   Smoking status: Never   Smokeless tobacco: Never  Vaping Use   Vaping status: Never Used  Substance and Sexual Activity   Alcohol use: Never   Drug use: Never   Sexual activity: Not Currently  Other Topics Concern   Not on file  Social History Narrative   ** Merged History Encounter **       Social Drivers of Health   Financial Resource Strain: Not on File (08/13/2021)   Received from [facility]  Dance movement psychotherapist Strain: 0  Food Insecurity: Not on File (01/20/2023)   Received from [employer]    Food: 0  Transportation Needs: Not on File (08/13/2021)   Received from [employer] Needs    Transportation: 0  Physical Activity: Not on File (08/13/2021)   Received from [facility]   Physical Activity    Physical Activity: 0  Stress: Not on File (08/13/2021)   Received from [facility]   Stress    Stress: 0  Social Connections: Not on File (01/03/2023)   Received from [employer]   Social Connections    Connectedness: 0  Intimate Partner Violence: Not on file    Family History:   History reviewed. No pertinent family history.   ROS:  Please see the history of present illness.  As stated in the HPI and negative for all other systems.   Phsical Exam/Data:   Vitals:   12/25/23 0330 12/25/23 0430 12/25/23 0643 12/25/23 0733  BP: 138/88 (!) 151/98  131/87  Pulse: 64 (!) 104  72  Resp: 10 13  17   Temp:   98.1 F (36.7 C) (!) 97.4 F (36.3 C)  TempSrc:   Oral Oral  SpO2: 98% 96%  97%  Weight:      Height:       No intake or output data in the 24 hours ending 12/25/23 0758 Filed Weights   12/24/23 1106  Weight: 103.9 kg   Body mass index is 25.8 kg/m.  GENERAL:  Wellappearing HEENT:   Pupils equal round and reactive, fundi not visualized, oral mucosa unremarkable NECK:  No  jugular venous distention, waveform within normal limits, carotid  upstroke brisk and symmetric, no bruits, no thyromegaly LYMPHATICS:  No cervical, inguinal adenopathy LUNGS:   Clear to auscultation bilaterally BACK:  No CVA tenderness CHEST:   Unremarkable HEART:  PMI not displaced or sustained,S1 and S2 within normal limits, no S3, no S4, no clicks, no rubs, no murmurs ABD:  Flat, positive bowel sounds normal in frequency in pitch, no bruits, no rebound, no guarding, no midline pulsatile mass, no hepatomegaly, no splenomegaly EXT:  2 plus pulses throughout,   mild right leg edema, no cyanosis no clubbing SKIN:  No rashes no nodules NEURO:   Cranial nerves II through XII grossly intact, motor grossly intact throughout PSYCH:    Cognitively intact, oriented to person place and time   EKG:  The EKG was personally reviewed and demonstrates:  August 30, 19:11 atrial flutter with 4 1 conduction  Telemetry:  Telemetry was personally reviewed and demonstrates:   Paroxysmal atrial flutter  Relevant CV Studies: Echo pending  Laboratory Data:  Chemistry Recent Labs  Lab 12/24/23 1128 12/25/23 0437  NA 142 143  K 4.4 4.0  CL 107 104  CO2 25 28  GLUCOSE 132* 102*  BUN 18 19  CREATININE 1.27* 1.42*  CALCIUM  9.6 10.1  GFRNONAA >60 55*  ANIONGAP 10 11    No results for input(s): PROT, ALBUMIN, AST, ALT, ALKPHOS, BILITOT in the last 168 hours. Hematology Recent Labs  Lab 12/24/23 1128 12/25/23 0437  WBC 4.1 4.9  RBC 4.66 4.57  HGB 12.5* 12.6*  HCT 40.1 38.5*  MCV 86.1 84.2  MCH 26.8 27.6  MCHC 31.2 32.7  RDW 13.8 13.7  PLT 249 281   Cardiac EnzymesNo results for input(s): TROPONINI in the last 168 hours. No results for input(s): TROPIPOC in the last 168 hours.  BNP Recent Labs  Lab 12/24/23 1507  PROBNP 3,163.0*    DDimer No results for input(s): DDIMER in the last 168 hours.  Radiology/Studies:  DG Chest 2 View Result Date: 12/24/2023 EXAM: 2 VIEW(S) XRAY OF THE CHEST 12/24/2023 11:40:00 AM COMPARISON: 2 view  chest x-ray 09/12/2023. CLINICAL HISTORY: SOB. FINDINGS: LUNGS AND PLEURA: Bibasilar airspace disease likely reflects atelectasis. Infection is not excluded. New small bilateral pleural effusions are present. HEART AND MEDIASTINUM: The heart is enlarged. BONES AND SOFT TISSUES: No acute osseous abnormality. IMPRESSION: 1. New small bilateral pleural effusions. 2. Bibasilar airspace disease, likely atelectasis. Infection not excluded. 3. Enlarged heart. Electronically signed by: Lonni Necessary MD 12/24/2023 11:45 AM EDT RP Workstation: [id number]    Assessment and Plan:    Chest pain: He does have some chest discomfort.  I am going to have a low threshold for right and left heart cath pending his echocardiogram.  I am asking the hospitalist to admit him at [facility].  Atrial fibrillation:   Will plan to start heparin  and hold his Xarelto  in anticipation of possible invasive procedure as above.  Agree with PO beta blocker.   Hypertension:  Manage with beta-blocker as above  Acute heart failure: Symptoms are consistent with heart failure.  BNP is elevated.  Chest x-ray with some edema.  Echocardiogram is pending.  Anticipate right and left heart cath.  OSA:   CPAP per primary team   QT prolongation:    Seen on the second EKG.  Repeat EKG this morning. Supplement potassium to keep above 4 magnesium  above 2.  CKD:  Creat is elevated but lower than previous.  Will hold ACE, ARB, ARNI.      DM: A1c is pending.  Most recent earlier this year was 6.4.    For questions or updates, please contact [facility] Please consult [email or web address] for contact info under Cardiology/STEMI.   Signed, Lynwood Schilling, MD  12/25/2023 7:58 AM

## 2023-12-25 NOTE — Progress Notes (Signed)
 PHARMACY - ANTICOAGULATION CONSULT NOTE  Pharmacy Consult for Xarelto  >> Heparin  Indication: atrial fibrillation  Allergies  Allergen Reactions   Other Cough    Unknown to patient   Milk (Cow)     Other reaction(s): VOMITING  Other Reaction(s): GI intolerance    Patient Measurements: Height: 6' 7 (200.7 cm) Weight: 103.9 kg (229 lb) IBW/kg (Calculated) : 93.7 HEPARIN  DW (KG): 103.9  Vital Signs: Temp: 97.7 F (36.5 C) (08/31 1134) Temp Source: Oral (08/31 1134) BP: 122/88 (08/31 1134) Pulse Rate: 68 (08/31 1134)  Labs: Recent Labs    12/24/23 1128 12/25/23 0437 12/25/23 0857 12/25/23 1834  HGB 12.5* 12.6*  --   --   HCT 40.1 38.5*  --   --   PLT 249 281  --   --   APTT  --   --  38* 63*  HEPARINUNFRC  --   --  >1.10*  --   CREATININE 1.27* 1.42*  --   --     Estimated Creatinine Clearance: 68.7 mL/min (A) (by C-G formula based on SCr of 1.42 mg/dL (H)).   Medical History: Past Medical History:  Diagnosis Date   A-fib [facility])    Status post ablation   Cancer [facility])    Tonsils   Thyroid disease     Medications:  PTA: Xarelto  10mg  daily - LD 12/24/23 @ 0730  Assessment: 63 yr male admitted with chest pain noted likely due to AFlutter with RVR and CHF.  PMH significant for AFib/flutter, CAD w/ stent, DM, HTN. Patient on low-dose Xarelto  and reports taking 10mg  daily.     12/25/2023 Pharmacy consulted to transition from Xarelto  to heparin  drip. Will utilize aPTT for dosing adjustments until heparin  level and aPTT correlate.  aPTT slightly below goal at 63 seconds.  No bleeding or IV issues noted.  Goal of Therapy:  Monitor platelets by anticoagulation protocol: Yes aPTT goal 66-102 seconds Heparin  level goal 0.3-0.7   Plan:  Increase heparin  infusion to 1600 units/hr  Daily CBC, heparin  level & aPTT F/u plans to restart Xarelto    [employer], Pharm.D., BCPS Clinical Pharmacist  **Pharmacist phone directory can be found on [email or web address] listed  under [facility].  12/25/2023 8:22 PM

## 2023-12-25 NOTE — Progress Notes (Signed)
 PHARMACY - ANTICOAGULATION CONSULT NOTE  Pharmacy Consult for Xarelto  >> Heparin  Indication: atrial fibrillation  Allergies  Allergen Reactions   Other Cough    Unknown to patient   Milk (Cow)     Other reaction(s): VOMITING  Other Reaction(s): GI intolerance    Patient Measurements: Height: 6' 7 (200.7 cm) Weight: 103.9 kg (229 lb) IBW/kg (Calculated) : 93.7 HEPARIN  DW (KG): 103.9  Vital Signs: Temp: 97.4 F (36.3 C) (08/31 0733) Temp Source: Oral (08/31 0733) BP: 131/87 (08/31 0733) Pulse Rate: 72 (08/31 0733)  Labs: Recent Labs    12/24/23 1128 12/25/23 0437  HGB 12.5* 12.6*  HCT 40.1 38.5*  PLT 249 281  CREATININE 1.27* 1.42*    Estimated Creatinine Clearance: 68.7 mL/min (A) (by C-G formula based on SCr of 1.42 mg/dL (H)).   Medical History: Past Medical History:  Diagnosis Date   A-fib [facility])    Status post ablation   Cancer [facility])    Tonsils   Thyroid disease     Medications:  PTA: Xarelto  10mg  daily - LD 12/24/23 @ 0730  Assessment: 63 yr male admitted with chest pain noted likely due to AFlutter with RVR and CHF.  PMH significant for AFib/flutter, CAD w/ stent, DM, HTN. Patient on low-dose Xarelto  and reports taking 10mg  daily.     12/25/2023 Pharmacy consulted to transition from Xarelto  to heparin  drip. Last dose of Xarelto  10 mg given 8/30 at 2200> was scheduled to begin Xarelto  20 today at 10 am CBC stable No bleeding reported  Goal of Therapy:  Monitor platelets by anticoagulation protocol: Yes   Plan:  Xarelto  20mg  daily dc'd  Draw baseline aPTT & heparin  level now (heparin  level will be elevated due to Xarelto ) Start heparin  drip at 1450 units/hr and check 8 hr aPTT Daily CBC, heparin  level & aPTT F/u for transition to Xarelto    Rosaline IVAR Edison, Pharm.D Use secure chat for questions 12/25/2023 8:40 AM

## 2023-12-25 NOTE — ED Notes (Signed)
Report called to Brooke, RN

## 2023-12-25 NOTE — Progress Notes (Signed)
 Gary Lawrence, [place], is aware PT has transferred to 4E23 [facility]) and has an order for CPAP HS.

## 2023-12-25 NOTE — Progress Notes (Signed)
   12/25/23 2258  BiPAP/CPAP/SIPAP  $ Non-Invasive Ventilator  Non-Invasive Vent Initial  $ Face Mask Large  Yes  BiPAP/CPAP/SIPAP Pt Type Adult  BiPAP/CPAP/SIPAP Resmed  Mask Type Full face mask  Mask Size Large  EPAP 8 cmH2O  FiO2 (%) 21 %  Patient Home Machine No  Patient Home Mask No  Patient Home Tubing No  Auto Titrate No  CPAP/SIPAP surface wiped down Yes  Device Plugged into RED Power Outlet Yes  BiPAP/CPAP /SiPAP Vitals  Pulse Rate 79  Resp 18  SpO2 98 %  Bilateral Breath Sounds Clear;Diminished  MEWS Score/Color  MEWS Score 0  MEWS Score Color Landy

## 2023-12-25 NOTE — ED Notes (Signed)
 [facility] called for transport.

## 2023-12-26 DIAGNOSIS — I5020 Unspecified systolic (congestive) heart failure: Secondary | ICD-10-CM | POA: Diagnosis not present

## 2023-12-26 DIAGNOSIS — R079 Chest pain, unspecified: Secondary | ICD-10-CM | POA: Diagnosis not present

## 2023-12-26 LAB — HEPARIN LEVEL (UNFRACTIONATED)
Heparin Unfractionated: 0.65 [IU]/mL (ref 0.30–0.70)
Heparin Unfractionated: 0.98 [IU]/mL — ABNORMAL HIGH (ref 0.30–0.70)
Heparin Unfractionated: 0.62 [IU]/mL (ref 0.30–0.70)

## 2023-12-26 LAB — CBC
HCT: 36.5 % — ABNORMAL LOW (ref 39.0–52.0)
Hemoglobin: 12.3 g/dL — ABNORMAL LOW (ref 13.0–17.0)
MCH: 27.2 pg (ref 26.0–34.0)
MCHC: 33.7 g/dL (ref 30.0–36.0)
MCV: 80.8 fL (ref 80.0–100.0)
Platelets: 255 K/uL (ref 150–400)
RBC: 4.52 MIL/uL (ref 4.22–5.81)
RDW: 13.3 % (ref 11.5–15.5)
WBC: 4.7 K/uL (ref 4.0–10.5)
nRBC: 0 % (ref 0.0–0.2)

## 2023-12-26 LAB — COMPREHENSIVE METABOLIC PANEL WITH GFR
ALT: 45 U/L — ABNORMAL HIGH (ref 0–44)
AST: 28 U/L (ref 15–41)
Albumin: 3.2 g/dL — ABNORMAL LOW (ref 3.5–5.0)
Alkaline Phosphatase: 45 U/L (ref 38–126)
Anion gap: 10 (ref 5–15)
BUN: 22 mg/dL (ref 8–23)
CO2: 28 mmol/L (ref 22–32)
Calcium: 9.6 mg/dL (ref 8.9–10.3)
Chloride: 101 mmol/L (ref 98–111)
Creatinine, Ser: 1.45 mg/dL — ABNORMAL HIGH (ref 0.61–1.24)
GFR, Estimated: 53 mL/min — ABNORMAL LOW (ref >60)
Glucose, Bld: 149 mg/dL — ABNORMAL HIGH (ref 70–99)
Potassium: 3.7 mmol/L (ref 3.5–5.1)
Sodium: 139 mmol/L (ref 135–145)
Total Bilirubin: 0.6 mg/dL (ref 0.0–1.2)
Total Protein: 6 g/dL — ABNORMAL LOW (ref 6.5–8.1)

## 2023-12-26 LAB — GLUCOSE, CAPILLARY
Glucose-Capillary: 126 mg/dL — ABNORMAL HIGH (ref 70–99)
Glucose-Capillary: 144 mg/dL — ABNORMAL HIGH (ref 70–99)
Glucose-Capillary: 92 mg/dL (ref 70–99)
Glucose-Capillary: 80 mg/dL (ref 70–99)

## 2023-12-26 LAB — LIPID PANEL
Cholesterol: 122 mg/dL (ref 0–200)
HDL: 47 mg/dL (ref >40)
LDL Cholesterol: 60 mg/dL (ref 0–99)
Total CHOL/HDL Ratio: 2.6 ratio
Triglycerides: 73 mg/dL (ref <150)
VLDL: 15 mg/dL (ref 0–40)

## 2023-12-26 LAB — APTT
aPTT: 98 s — ABNORMAL HIGH (ref 24–36)
aPTT: 119 s — ABNORMAL HIGH (ref 24–36)
aPTT: 95 s — ABNORMAL HIGH (ref 24–36)

## 2023-12-26 MED ORDER — DIGOXIN 125 MCG PO TABS
0.2500 mg | ORAL_TABLET | Freq: Every day | ORAL | Status: DC
  Administered 2023-12-26: 0.25 mg via ORAL
  Filled 2023-12-26: qty 2

## 2023-12-26 NOTE — Progress Notes (Signed)
  Progress Note  Patient Name: Gary Lawrence Date of Encounter: 12/26/2023 [facility] Cardiologist: None   Interval Summary   Feeling better today. Still unable to lie flat but comfortable at 30 degrees with no supplemental O2. No chest pain.  Vital Signs Vitals:   12/25/23 2258 12/25/23 2319 12/26/23 0529 12/26/23 0828  BP:  (!) 117/94 133/77 (!) 129/97  Pulse: 79 (!) 51 (!) 103 78  Resp: 18 20  20   Temp:  (!) 97.3 F (36.3 C) 97.7 F (36.5 C) 97.7 F (36.5 C)  TempSrc:  Axillary Oral Oral  SpO2: 98% 98% 96% 99%  Weight:  100.3 kg 100.3 kg   Height:        Intake/Output Summary (Last 24 hours) at 12/26/2023 0902 Last data filed at 12/26/2023 0826 Gross per 24 hour  Intake --  Output 900 ml  Net -900 ml      12/26/2023    5:29 AM 12/25/2023   11:19 PM 12/24/2023   11:06 AM  Last 3 Weights  Weight (lbs) 221 lb 1.9 oz 221 lb 1.9 oz 229 lb  Weight (kg) 100.3 kg 100.3 kg 103.874 kg      Telemetry/ECG  Atrial flutter with variable response. HR 80-140 bpm - Personally Reviewed  Physical Exam  GEN: No acute distress.   Neck: No JVD Cardiac: irregularly irregular, no murmurs, rubs, or gallops.  Respiratory: Clear to auscultation bilaterally. GI: Soft, nontender, non-distended  MS: No edema  Assessment & Plan  Atrial flutter with RVR: on oral metoprolol  for rate control. Xarelto  held. On IV heparin . Looks like he has been in fib/flutter since at least May 2025 by EKG review. He had an ablation in 2018 and was in sinus rhythm at cardiology follow-up at [facility] in 2023. Add Digoxin  0.25 mg daily. Resume Xarelto  post-cath (see below). Need to consider rhythm control in setting of his heart failure.  Acute systolic heart failure: Echo resulted and LVEF reduced at 35%. Needs ischemic evaluation to rule out obstructive CAD. Recommend R/L heart cath to evaluate hemodynamics and coronary anatomy. I have reviewed the risks, indications, and alternatives to cardiac catheterization,  possible angioplasty, and stenting with the patient. Risks include but are not limited to bleeding, infection, vascular injury, stroke, myocardial infection, arrhythmia, kidney injury, radiation-related injury in the case of prolonged fluoroscopy use, emergency cardiac surgery, and death. The patient understands the risks of serious complication is 1-2 in 1000 with diagnostic cardiac cath and 1-2% or less with angioplasty/stenting.  Continue IV furosemide  40 mg daily.  CKD 3A - stable, creatinine 1.45 today. Disposition: pending cath result    For questions or updates, please contact [facility] Please consult [email or web address] for contact info under       Signed, Ozell Fell, MD

## 2023-12-26 NOTE — Progress Notes (Signed)
 PHARMACY - ANTICOAGULATION CONSULT NOTE  Pharmacy Consult for Xarelto  >> Heparin  Indication: atrial fibrillation  Allergies  Allergen Reactions   Other Cough    Unknown to patient   Milk (Cow)     Other reaction(s): VOMITING  Other Reaction(s): GI intolerance    Patient Measurements: Height: 6' 7 (200.7 cm) Weight: 100.3 kg (221 lb 1.9 oz) IBW/kg (Calculated) : 93.7 HEPARIN  DW (KG): 103.9  Vital Signs: Temp: 97.6 F (36.4 C) (09/01 1317) Temp Source: Oral (09/01 1317) BP: 121/83 (09/01 1317) Pulse Rate: 71 (09/01 1317)  Labs: Recent Labs    12/24/23 1128 12/25/23 0437 12/25/23 0857 12/25/23 0857 12/25/23 1834 12/26/23 0521 12/26/23 1135  HGB 12.5* 12.6*  --   --   --  12.3*  --   HCT 40.1 38.5*  --   --   --  36.5*  --   PLT 249 281  --   --   --  255  --   APTT  --   --  38*   < > 63* 98* 119*  HEPARINUNFRC  --   --  >1.10*  --   --  0.65 0.98*  CREATININE 1.27* 1.42*  --   --   --  1.45*  --    < > = values in this interval not displayed.    Estimated Creatinine Clearance: 67.3 mL/min (A) (by C-G formula based on SCr of 1.45 mg/dL (H)).   Medical History: Past Medical History:  Diagnosis Date   A-fib [facility])    Status post ablation   Cancer [facility])    Tonsils   Thyroid disease     Medications:  PTA: Xarelto  10mg  daily - LD 12/24/23 @ 0730  Assessment: 63 yr male admitted with chest pain noted likely due to AFlutter with RVR and CHF.  PMH significant for AFib/flutter, CAD w/ stent, DM, HTN. Patient on low-dose Xarelto  and reports taking 10mg  daily.    Pharmacy consulted to transition from Xarelto  to heparin  drip. Will utilize aPTT for dosing adjustments until heparin  level and aPTT correlate.    Heparin  level at 0.98 and aPTT 119 supratherapeutic on 1600 units/hr. Per RN, no issues with infusion and no signs of bleeding. Level was drawn appropriately.   Goal of Therapy:  Monitor platelets by anticoagulation protocol: Yes aPTT goal 66-102  seconds Heparin  level goal 0.3-0.7   Plan:  Decrease heparin  infusion by 200units (2u/kg) to 1400 units/hr  Check aPTT and heparin  level in 6 hours Daily CBC, heparin  level & aPTT F/u plans to restart Xarelto    Dionicia Canavan, PharmD, RPh PGY1 Acute Care Pharmacy Resident [facility]  12/26/2023 1:35 PM

## 2023-12-26 NOTE — Progress Notes (Signed)
 PHARMACY - ANTICOAGULATION CONSULT NOTE  Pharmacy Consult for Xarelto  >> Heparin  Indication: atrial fibrillation  Allergies  Allergen Reactions   Other Cough    Unknown to patient   Milk (Cow)     Other reaction(s): VOMITING  Other Reaction(s): GI intolerance    Patient Measurements: Height: 6' 7 (200.7 cm) Weight: 100.3 kg (221 lb 1.9 oz) IBW/kg (Calculated) : 93.7 HEPARIN  DW (KG): 103.9  Vital Signs: Temp: 97.7 F (36.5 C) (09/01 0828) Temp Source: Oral (09/01 0828) BP: 129/97 (09/01 0828) Pulse Rate: 78 (09/01 0828)  Labs: Recent Labs    12/24/23 1128 12/25/23 0437 12/25/23 0857 12/25/23 1834 12/26/23 0521  HGB 12.5* 12.6*  --   --  12.3*  HCT 40.1 38.5*  --   --  36.5*  PLT 249 281  --   --  255  APTT  --   --  38* 63* 98*  HEPARINUNFRC  --   --  >1.10*  --  0.65  CREATININE 1.27* 1.42*  --   --  1.45*    Estimated Creatinine Clearance: 67.3 mL/min (A) (by C-G formula based on SCr of 1.45 mg/dL (H)).   Medical History: Past Medical History:  Diagnosis Date   A-fib [facility])    Status post ablation   Cancer [facility])    Tonsils   Thyroid disease     Medications:  PTA: Xarelto  10mg  daily - LD 12/24/23 @ 0730  Assessment: 63 yr male admitted with chest pain noted likely due to AFlutter with RVR and CHF.  PMH significant for AFib/flutter, CAD w/ stent, DM, HTN. Patient on low-dose Xarelto  and reports taking 10mg  daily.    Pharmacy consulted to transition from Xarelto  to heparin  drip. Will utilize aPTT for dosing adjustments until heparin  level and aPTT correlate.    Heparin  level at 0.65 and aPTT at 98 seconds both therapeutic on 1600 units/hr. No issues heparin  infusion or signs of bleed per RN.  CBC stable.  Goal of Therapy:  Monitor platelets by anticoagulation protocol: Yes aPTT goal 66-102 seconds Heparin  level goal 0.3-0.7   Plan:  Continue heparin  infusion to 1600 units/hr  Check confirmatory aPTT and heparin  level in 6 hours Daily CBC,  heparin  level & aPTT F/u plans to restart Xarelto    Dionicia Canavan, PharmD, RPh PGY1 Acute Care Pharmacy Resident [facility]  12/26/2023 10:40 AM

## 2023-12-26 NOTE — Plan of Care (Signed)
  Problem: Education: Goal: Ability to describe self-care measures that may prevent or decrease complications (Diabetes Survival Skills Education) will improve Outcome: Progressing Goal: Individualized Educational Video(s) Outcome: Progressing   Problem: Clinical Measurements: Goal: Ability to maintain clinical measurements within normal limits will improve Outcome: Progressing Goal: Will remain free from infection Outcome: Progressing Goal: Diagnostic test results will improve Outcome: Progressing Goal: Respiratory complications will improve Outcome: Progressing Goal: Cardiovascular complication will be avoided Outcome: Progressing

## 2023-12-26 NOTE — Progress Notes (Signed)
 PROGRESS NOTE    Gary Lawrence  FMW:[id number] DOB: 03/07/1961 DOA: 12/24/2023 PCP: [name], FNP   Brief Narrative:  This 63 yrs old male with PMH significant for CAD /remote stent, A-fib/flutter on low-dose Xarelto , oropharyngeal cancer, HTN, GERD, NIDDM-2 and OSA on CPAP presented in the ED with progressive dizziness, chest pain and shortness of breath.  Initially presented to urgent care but sent to ED for further evaluation.  Patient was stung by hornets a week prior to admission.  Ever since he has been feeling poorly. He also developed shortness of breath.  In the emergency department he was noted to be in atrial flutter with RVR.  BNP was noted to be elevated.  He was hospitalized for further management.  Cardiology evaluation completed recommended transfer patient to [facility] for possible left heart cath.  Assessment & Plan:   Principal Problem:   Chest pain Active Problems:   Atrial flutter with rapid ventricular response (HCC)  Atrial flutter with RVR: Patient has longstanding history of atrial flutter, well-controlled on metoprolol . Continue metoprolol  and Xarelto . Current RVR may have been induced by recent hornet stings. Heart rate seems to be poorly controlled. Evaluated by cardiology.  He has been transitioned from Xarelto  to IV heparin .   He may need further cardiac workup with possible cardiac catheterization.   Monitor on telemetry. Echo completed, showed LVEF 35-40%,  LV had decreased systolic function. Check TSH.   Acute systolic CHF: Echo shows LVEF 35 to 40%, decreased LV function. Continue furosemide  40 mg IV daily.  Creatinine noted to be slightly higher today.   Monitor daily intake / output charting, daily weight. Chest x-ray showed bilateral small pleural effusions.   Chest pain/dyspnea/lightheadedness: Most likely due to atrial flutter with RVR.   Troponins were only mildly elevated.  CHF also contributing. Xarelto  has been  transitioned to heparin  by cardiology.   Consideration is being given to cardiac catheterization during this admission.   History of coronary artery disease: Remote stent in the past.  Continue metoprolol  and statin.   Diabetes mellitus type 2, controlled: HbA1c is pending.  Monitor CBGs.   He is just on oral medications prior to admission.   Currently on SSI.   Obstructive sleep apnea: CPAP at nighttime.  CKD stage IIIb: Baseline serum creatinine around 1.2-1.4. Monitor serum creatinine while on Lasix .   DVT prophylaxis: IV Heparin . Code Status: Full code Family Communication: No family at bed side. Disposition Plan:    Status is: Inpatient Remains inpatient appropriate because: Severity of illness.    Consultants:  Cardiology  Procedures: Echocardiogram Antimicrobials: Anti-infectives (From admission, onward)    None       Subjective: Patient was seen and examined at bedside.  Overnight events noted. Patient reports feeling much improved.  Patient denies any chest pain.  Heart rate is still uncontrolled.  Objective: Vitals:   12/25/23 2258 12/25/23 2319 12/26/23 0529 12/26/23 0828  BP:  (!) 117/94 133/77 (!) 129/97  Pulse: 79 (!) 51 (!) 103 78  Resp: 18 20  20   Temp:  (!) 97.3 F (36.3 C) 97.7 F (36.5 C) 97.7 F (36.5 C)  TempSrc:  Axillary Oral Oral  SpO2: 98% 98% 96% 99%  Weight:  100.3 kg 100.3 kg   Height:        Intake/Output Summary (Last 24 hours) at 12/26/2023 1014 Last data filed at 12/26/2023 0826 Gross per 24 hour  Intake --  Output 900 ml  Net -900 ml   Filed  Weights   12/24/23 1106 12/25/23 2319 12/26/23 0529  Weight: 103.9 kg 100.3 kg 100.3 kg    Examination:  General exam: Appears calm and comfortable, not in any acute distress. Respiratory system: CTA bilaterally. Respiratory effort normal.  RR 16 Cardiovascular system: S1 & S2 heard, Irregular rhythm, no JVD, murmurs, rubs, gallops or clicks.  Gastrointestinal system: Abdomen  is non distended, soft and non tender.  Normal bowel sounds heard. Central nervous system: Alert and oriented x 3. No focal neurological deficits. Extremities: No edema, no cyanosis, no clubbing Skin: No rashes, lesions or ulcers Psychiatry: Judgement and insight appear normal. Mood & affect appropriate.     Data Reviewed: I have personally reviewed following labs and imaging studies  CBC: Recent Labs  Lab 12/24/23 1128 12/25/23 0437 12/26/23 0521  WBC 4.1 4.9 4.7  NEUTROABS 2.4  --   --   HGB 12.5* 12.6* 12.3*  HCT 40.1 38.5* 36.5*  MCV 86.1 84.2 80.8  PLT 249 281 255   Basic Metabolic Panel: Recent Labs  Lab 12/24/23 1128 12/25/23 0437 12/25/23 1207 12/26/23 0521  NA 142 143  --  139  K 4.4 4.0  --  3.7  CL 107 104  --  101  CO2 25 28  --  28  GLUCOSE 132* 102*  --  149*  BUN 18 19  --  22  CREATININE 1.27* 1.42*  --  1.45*  CALCIUM  9.6 10.1  --  9.6  MG  --   --  1.2*  --    GFR: Estimated Creatinine Clearance: 67.3 mL/min (A) (by C-G formula based on SCr of 1.45 mg/dL (H)). Liver Function Tests: Recent Labs  Lab 12/26/23 0521  AST 28  ALT 45*  ALKPHOS 45  BILITOT 0.6  PROT 6.0*  ALBUMIN 3.2*   No results for input(s): LIPASE, AMYLASE in the last 168 hours. No results for input(s): AMMONIA in the last 168 hours. Coagulation Profile: No results for input(s): INR, PROTIME in the last 168 hours. Cardiac Enzymes: No results for input(s): CKTOTAL, CKMB, CKMBINDEX, TROPONINI in the last 168 hours. BNP (last 3 results) Recent Labs    12/24/23 1507  PROBNP 3,163.0*   HbA1C: No results for input(s): HGBA1C in the last 72 hours. CBG: Recent Labs  Lab 12/25/23 0736 12/25/23 1137 12/25/23 1541 12/25/23 2111 12/26/23 0620  GLUCAP 107* 237* 73 137* 126*   Lipid Profile: Recent Labs    12/26/23 0521  CHOL 122  HDL 47  LDLCALC 60  TRIG 73  CHOLHDL 2.6   Thyroid Function Tests: No results for input(s): TSH, T4TOTAL,  FREET4, T3FREE, THYROIDAB in the last 72 hours. Anemia Panel: No results for input(s): VITAMINB12, FOLATE, FERRITIN, TIBC, IRON, RETICCTPCT in the last 72 hours. Sepsis Labs: No results for input(s): PROCALCITON, LATICACIDVEN in the last 168 hours.  No results found for this or any previous visit (from the past 240 hours).   Radiology Studies: ECHOCARDIOGRAM COMPLETE Result Date: 12/25/2023    ECHOCARDIOGRAM REPORT   Patient Name:   Gary Lawrence Date of Exam: 12/25/2023 Medical Rec #:  [id number]      Height:       79.0 in Accession #:    [id number]     Weight:       229.0 lb Date of Birth:  Jul 28, 1960       BSA:          2.414 m Patient Age:    26 years  BP:           122/88 mmHg Patient Gender: M              HR:           68 bpm. Exam Location:  Inpatient Procedure: Cardiac Doppler, Color Doppler and Strain Analysis (Both Spectral and            Color Flow Doppler were utilized during procedure). Indications:    Chest Pain R07.9  History:        Patient has no prior history of Echocardiogram examinations.                 Arrythmias:Atrial Fibrillation and Atrial Flutter; Risk                 Factors:Hypertension, Diabetes and Dyslipidemia.  Sonographer:    Aida Pizza RCS Referring Phys: [id number] MIGNON DASEN GONFA  Sonographer Comments: Global longitudinal strain was attempted. IMPRESSIONS  1. Left ventricular ejection fraction, by estimation, is 35 to 40%. The left ventricle has moderately decreased function. The left ventricle demonstrates global hypokinesis. The left ventricular internal cavity size was mildly dilated. There is mild concentric left ventricular hypertrophy. Left ventricular diastolic parameters are consistent with Grade I diastolic dysfunction (impaired relaxation). The average left ventricular global longitudinal strain is -6.7 %. The global longitudinal strain is abnormal.  2. Right ventricular systolic function is normal. The right ventricular size is normal.  Tricuspid regurgitation signal is inadequate for assessing PA pressure.  3. Left atrial size was moderately dilated.  4. The mitral valve is normal in structure. Mild to moderate mitral valve regurgitation. No evidence of mitral stenosis.  5. The aortic valve is normal in structure. Aortic valve regurgitation is not visualized. Aortic valve sclerosis is present, with no evidence of aortic valve stenosis.  6. The inferior vena cava is normal in size with greater than 50% respiratory variability, suggesting right atrial pressure of 3 mmHg. FINDINGS  Left Ventricle: Left ventricular ejection fraction, by estimation, is 35 to 40%. The left ventricle has moderately decreased function. The left ventricle demonstrates global hypokinesis. The average left ventricular global longitudinal strain is -6.7 %.  Strain was performed and the global longitudinal strain is abnormal. Global longitudinal strain performed but not reported based on interpreter judgement due to suboptimal tracking. The left ventricular internal cavity size was mildly dilated. There is mild concentric left ventricular hypertrophy. Left ventricular diastolic parameters are consistent with Grade I diastolic dysfunction (impaired relaxation). Right Ventricle: The right ventricular size is normal. No increase in right ventricular wall thickness. Right ventricular systolic function is normal. Tricuspid regurgitation signal is inadequate for assessing PA pressure. Left Atrium: Left atrial size was moderately dilated. Right Atrium: Right atrial size was normal in size. Pericardium: There is no evidence of pericardial effusion. Mitral Valve: The mitral valve is normal in structure. Mild to moderate mitral valve regurgitation. No evidence of mitral valve stenosis. Tricuspid Valve: The tricuspid valve is normal in structure. Tricuspid valve regurgitation is not demonstrated. No evidence of tricuspid stenosis. Aortic Valve: The aortic valve is normal in structure.  Aortic valve regurgitation is not visualized. Aortic valve sclerosis is present, with no evidence of aortic valve stenosis. Aortic valve mean gradient measures 2.0 mmHg. Aortic valve peak gradient measures 3.7 mmHg. Aortic valve area, by VTI measures 2.43 cm. Pulmonic Valve: The pulmonic valve was normal in structure. Pulmonic valve regurgitation is not visualized. No evidence of pulmonic stenosis. Aorta: The aortic root is normal  in size and structure. Venous: The inferior vena cava is normal in size with greater than 50% respiratory variability, suggesting right atrial pressure of 3 mmHg. IAS/Shunts: No atrial level shunt detected by color flow Doppler.  LEFT VENTRICLE PLAX 2D LVIDd:         5.89 cm   Diastology LVIDs:         4.82 cm   LV e' medial:    5.77 cm/s LV PW:         1.07 cm   LV E/e' medial:  15.9 LV IVS:        1.03 cm   LV e' lateral:   11.00 cm/s LVOT diam:     2.07 cm   LV E/e' lateral: 8.3 LV SV:         44 LV SV Index:   18        2D Longitudinal Strain LVOT Area:     3.37 cm  2D Strain GLS Avg:     -6.7 %                           3D Volume EF:                          3D EF:        43 %                          LV EDV:       353 ml                          LV ESV:       200 ml                          LV SV:        153 ml RIGHT VENTRICLE RV S prime:     12.00 cm/s TAPSE (M-mode): 2.0 cm LEFT ATRIUM              Index        RIGHT ATRIUM           Index LA diam:        4.55 cm  1.88 cm/m   RA Area:     20.50 cm LA Vol (A2C):   109.0 ml 45.15 ml/m  RA Volume:   65.60 ml  27.18 ml/m LA Vol (A4C):   86.5 ml  35.83 ml/m LA Biplane Vol: 99.2 ml  41.09 ml/m  AORTIC VALVE AV Area (Vmax):    2.64 cm AV Area (Vmean):   2.63 cm AV Area (VTI):     2.43 cm AV Vmax:           95.70 cm/s AV Vmean:          66.200 cm/s AV VTI:            0.183 m AV Peak Grad:      3.7 mmHg AV Mean Grad:      2.0 mmHg LVOT Vmax:         75.00 cm/s LVOT Vmean:        51.700 cm/s LVOT VTI:          0.132 m LVOT/AV VTI  ratio: 0.72  AORTA Ao Root diam: 3.38 cm MITRAL VALVE MV Area (PHT):  5.84 cm    SHUNTS MV Decel Time: 130 msec    Systemic VTI:  0.13 m MV E velocity: 91.60 cm/s  Systemic Diam: 2.07 cm MV A velocity: 20.40 cm/s MV E/A ratio:  4.49 Kardie Tobb DO Electronically signed by Dub Huntsman DO Signature Date/Time: 12/25/2023/6:14:51 PM    Final    DG Chest 2 View Result Date: 12/24/2023 EXAM: 2 VIEW(S) XRAY OF THE CHEST 12/24/2023 11:40:00 AM COMPARISON: 2 view chest x-ray 09/12/2023. CLINICAL HISTORY: SOB. FINDINGS: LUNGS AND PLEURA: Bibasilar airspace disease likely reflects atelectasis. Infection is not excluded. New small bilateral pleural effusions are present. HEART AND MEDIASTINUM: The heart is enlarged. BONES AND SOFT TISSUES: No acute osseous abnormality. IMPRESSION: 1. New small bilateral pleural effusions. 2. Bibasilar airspace disease, likely atelectasis. Infection not excluded. 3. Enlarged heart. Electronically signed by: Lonni Necessary MD 12/24/2023 11:45 AM EDT RP Workstation: [id number]   Scheduled Meds:  atorvastatin   40 mg Oral Daily   brimonidine   1 drop Both Eyes Q8H   furosemide   40 mg Intravenous Daily   insulin  aspart  0-5 Units Subcutaneous QHS   insulin  aspart  0-9 Units Subcutaneous TID WC   metoprolol  tartrate  100 mg Oral BID   pantoprazole   40 mg Oral Daily   Continuous Infusions:  heparin  1,600 Units/hr (12/25/23 2200)     LOS: 1 day    Time spent: 50 mins    Darcel Dawley, MD [facility]   If 7PM-7AM, please contact night-coverage

## 2023-12-27 ENCOUNTER — Encounter (HOSPITAL_COMMUNITY)
Admission: EM | Disposition: A | Discharge status: Home / Self Care | Payer: Self-pay | Source: Home / Self Care | Attending: Family Medicine

## 2023-12-27 ENCOUNTER — Encounter (HOSPITAL_COMMUNITY): Payer: Self-pay | Admitting: Cardiovascular Disease

## 2023-12-27 DIAGNOSIS — I2510 Atherosclerotic heart disease of native coronary artery without angina pectoris: Secondary | ICD-10-CM

## 2023-12-27 DIAGNOSIS — I5021 Acute systolic (congestive) heart failure: Secondary | ICD-10-CM

## 2023-12-27 DIAGNOSIS — N1831 Chronic kidney disease, stage 3a: Secondary | ICD-10-CM

## 2023-12-27 DIAGNOSIS — R079 Chest pain, unspecified: Secondary | ICD-10-CM | POA: Diagnosis not present

## 2023-12-27 HISTORY — PX: RIGHT/LEFT HEART CATH AND CORONARY ANGIOGRAPHY: CATH118266

## 2023-12-27 LAB — APTT: aPTT: 79 s — ABNORMAL HIGH (ref 24–36)

## 2023-12-27 LAB — CBC
HCT: 37.4 % — ABNORMAL LOW (ref 39.0–52.0)
Hemoglobin: 12.3 g/dL — ABNORMAL LOW (ref 13.0–17.0)
MCH: 27 pg (ref 26.0–34.0)
MCHC: 32.9 g/dL (ref 30.0–36.0)
MCV: 82 fL (ref 80.0–100.0)
Platelets: 274 K/uL (ref 150–400)
RBC: 4.56 MIL/uL (ref 4.22–5.81)
RDW: 13.6 % (ref 11.5–15.5)
WBC: 3.7 K/uL — ABNORMAL LOW (ref 4.0–10.5)
nRBC: 0 % (ref 0.0–0.2)

## 2023-12-27 LAB — POCT I-STAT 7, (LYTES, BLD GAS, ICA,H+H)
Acid-Base Excess: 2 mmol/L (ref 0.0–2.0)
Bicarbonate: 26.6 mmol/L (ref 20.0–28.0)
Calcium, Ion: 1.24 mmol/L (ref 1.15–1.40)
HCT: 37 % — ABNORMAL LOW (ref 39.0–52.0)
Hemoglobin: 12.6 g/dL — ABNORMAL LOW (ref 13.0–17.0)
O2 Saturation: 92 %
Potassium: 3.7 mmol/L (ref 3.5–5.1)
Sodium: 140 mmol/L (ref 135–145)
TCO2: 28 mmol/L (ref 22–32)
pCO2 arterial: 40.8 mmHg (ref 32–48)
pH, Arterial: 7.423 (ref 7.35–7.45)
pO2, Arterial: 63 mmHg — ABNORMAL LOW (ref 83–108)

## 2023-12-27 LAB — GLUCOSE, CAPILLARY
Glucose-Capillary: 110 mg/dL — ABNORMAL HIGH (ref 70–99)
Glucose-Capillary: 118 mg/dL — ABNORMAL HIGH (ref 70–99)
Glucose-Capillary: 96 mg/dL (ref 70–99)
Glucose-Capillary: 161 mg/dL — ABNORMAL HIGH (ref 70–99)

## 2023-12-27 LAB — COMPREHENSIVE METABOLIC PANEL WITH GFR
ALT: 37 U/L (ref 0–44)
AST: 20 U/L (ref 15–41)
Albumin: 3.2 g/dL — ABNORMAL LOW (ref 3.5–5.0)
Alkaline Phosphatase: 42 U/L (ref 38–126)
Anion gap: 8 (ref 5–15)
BUN: 23 mg/dL (ref 8–23)
CO2: 28 mmol/L (ref 22–32)
Calcium: 9.2 mg/dL (ref 8.9–10.3)
Chloride: 101 mmol/L (ref 98–111)
Creatinine, Ser: 1.45 mg/dL — ABNORMAL HIGH (ref 0.61–1.24)
GFR, Estimated: 53 mL/min — ABNORMAL LOW (ref >60)
Glucose, Bld: 124 mg/dL — ABNORMAL HIGH (ref 70–99)
Potassium: 4 mmol/L (ref 3.5–5.1)
Sodium: 137 mmol/L (ref 135–145)
Total Bilirubin: 0.6 mg/dL (ref 0.0–1.2)
Total Protein: 6.1 g/dL — ABNORMAL LOW (ref 6.5–8.1)

## 2023-12-27 LAB — MAGNESIUM: Magnesium: 1.7 mg/dL (ref 1.7–2.4)

## 2023-12-27 LAB — HEMOGLOBIN A1C
Hgb A1c MFr Bld: 6.5 % — ABNORMAL HIGH (ref 4.8–5.6)
Mean Plasma Glucose: 140 mg/dL

## 2023-12-27 LAB — MRSA NEXT GEN BY PCR, NASAL: MRSA by PCR Next Gen: NOT DETECTED

## 2023-12-27 LAB — PHOSPHORUS: Phosphorus: 4 mg/dL (ref 2.5–4.6)

## 2023-12-27 LAB — TSH: TSH: 1.637 u[IU]/mL (ref 0.350–4.500)

## 2023-12-27 LAB — HEPARIN LEVEL (UNFRACTIONATED): Heparin Unfractionated: 0.47 [IU]/mL (ref 0.30–0.70)

## 2023-12-27 SURGERY — RIGHT/LEFT HEART CATH AND CORONARY ANGIOGRAPHY
Anesthesia: LOCAL

## 2023-12-27 MED ORDER — FENTANYL CITRATE (PF) 100 MCG/2ML IJ SOLN
INTRAMUSCULAR | Status: AC
  Filled 2023-12-27: qty 2

## 2023-12-27 MED ORDER — VERAPAMIL HCL 2.5 MG/ML IV SOLN
INTRAVENOUS | Status: DC | PRN
  Administered 2023-12-27: 10 mL via INTRA_ARTERIAL

## 2023-12-27 MED ORDER — HEPARIN SODIUM (PORCINE) 1000 UNIT/ML IJ SOLN
INTRAMUSCULAR | Status: AC
  Filled 2023-12-27: qty 10

## 2023-12-27 MED ORDER — FREE WATER: 250.0000 mL | Freq: Once | Status: DC

## 2023-12-27 MED ORDER — FREE WATER: 500.0000 mL | Freq: Once | Status: DC

## 2023-12-27 MED ORDER — VERAPAMIL HCL 2.5 MG/ML IV SOLN
INTRAVENOUS | Status: AC
  Filled 2023-12-27: qty 2

## 2023-12-27 MED ORDER — SODIUM CHLORIDE 0.9% FLUSH
3.0000 mL | Freq: Two times a day (BID) | INTRAVENOUS | Status: DC
  Administered 2023-12-27 – 2023-12-28 (×2): 3 mL via INTRAVENOUS

## 2023-12-27 MED ORDER — LIDOCAINE HCL (PF) 1 % IJ SOLN
INTRAMUSCULAR | Status: AC
  Filled 2023-12-27: qty 30

## 2023-12-27 MED ORDER — MIDAZOLAM HCL 2 MG/2ML IJ SOLN
INTRAMUSCULAR | Status: DC | PRN
  Administered 2023-12-27: 2 mg via INTRAVENOUS

## 2023-12-27 MED ORDER — EMPAGLIFLOZIN 10 MG PO TABS
10.0000 mg | ORAL_TABLET | Freq: Every day | ORAL | Status: DC
  Administered 2023-12-28: 10 mg via ORAL
  Filled 2023-12-27: qty 1

## 2023-12-27 MED ORDER — MUSCLE RUB 10-15 % EX CREA
TOPICAL_CREAM | CUTANEOUS | Status: DC | PRN
  Filled 2023-12-27: qty 85

## 2023-12-27 MED ORDER — SPIRONOLACTONE 12.5 MG HALF TABLET
12.5000 mg | ORAL_TABLET | Freq: Every day | ORAL | Status: DC
  Administered 2023-12-27 – 2023-12-28 (×2): 12.5 mg via ORAL
  Filled 2023-12-27 (×2): qty 1

## 2023-12-27 MED ORDER — MIDAZOLAM HCL 2 MG/2ML IJ SOLN
INTRAMUSCULAR | Status: AC
  Filled 2023-12-27: qty 2

## 2023-12-27 MED ORDER — HYDRALAZINE HCL 20 MG/ML IJ SOLN: 10.0000 mg | INTRAMUSCULAR | Status: AC | PRN

## 2023-12-27 MED ORDER — LIDOCAINE HCL (PF) 1 % IJ SOLN
INTRAMUSCULAR | Status: DC | PRN
  Administered 2023-12-27 (×2): 2 mL via INTRADERMAL

## 2023-12-27 MED ORDER — HEPARIN SODIUM (PORCINE) 1000 UNIT/ML IJ SOLN
INTRAMUSCULAR | Status: DC | PRN
  Administered 2023-12-27: 5000 [IU] via INTRAVENOUS

## 2023-12-27 MED ORDER — FENTANYL CITRATE (PF) 100 MCG/2ML IJ SOLN
INTRAMUSCULAR | Status: DC | PRN
  Administered 2023-12-27: 50 ug via INTRAVENOUS

## 2023-12-27 MED ORDER — DIGOXIN 125 MCG PO TABS
0.1250 mg | ORAL_TABLET | Freq: Every day | ORAL | Status: DC
  Administered 2023-12-27 – 2023-12-28 (×2): 0.125 mg via ORAL
  Filled 2023-12-27 (×2): qty 1

## 2023-12-27 MED ORDER — METOPROLOL SUCCINATE ER 25 MG PO TB24
25.0000 mg | ORAL_TABLET | Freq: Every day | ORAL | Status: DC
  Administered 2023-12-27 – 2023-12-28 (×2): 25 mg via ORAL
  Filled 2023-12-27 (×2): qty 1

## 2023-12-27 MED ORDER — SODIUM CHLORIDE 0.9% FLUSH: 3.0000 mL | INTRAVENOUS | Status: DC | PRN

## 2023-12-27 MED ORDER — SODIUM CHLORIDE 0.9 % IV SOLN: 250.0000 mL | INTRAVENOUS | Status: DC | PRN

## 2023-12-27 MED ORDER — IOHEXOL 350 MG/ML SOLN
INTRAVENOUS | Status: DC | PRN
  Administered 2023-12-27: 30 mL

## 2023-12-27 MED ORDER — ASPIRIN 81 MG PO CHEW
81.0000 mg | CHEWABLE_TABLET | ORAL | Status: AC
  Administered 2023-12-27: 81 mg via ORAL
  Filled 2023-12-27: qty 1

## 2023-12-27 MED ORDER — FUROSEMIDE 10 MG/ML IJ SOLN
40.0000 mg | Freq: Once | INTRAMUSCULAR | Status: AC
  Administered 2023-12-27: 40 mg via INTRAVENOUS
  Filled 2023-12-27: qty 4

## 2023-12-27 MED ORDER — AMIODARONE HCL 200 MG PO TABS: 200.0000 mg | ORAL_TABLET | Freq: Every day | ORAL | Status: DC

## 2023-12-27 MED ORDER — AMIODARONE HCL 200 MG PO TABS
400.0000 mg | ORAL_TABLET | Freq: Two times a day (BID) | ORAL | Status: DC
  Administered 2023-12-27 – 2023-12-28 (×3): 400 mg via ORAL
  Filled 2023-12-27 (×3): qty 2

## 2023-12-27 MED ORDER — DIGOXIN 125 MCG PO TABS: 0.1250 mg | ORAL_TABLET | Freq: Every day | ORAL | Status: DC

## 2023-12-27 MED ORDER — HEPARIN (PORCINE) IN NACL 1000-0.9 UT/500ML-% IV SOLN
INTRAVENOUS | Status: DC | PRN
  Administered 2023-12-27 (×2): 500 mL

## 2023-12-27 SURGICAL SUPPLY — 10 items
CATH 5FR JL3.5 JR4 ANG PIG MP (CATHETERS) IMPLANT
CATH BALLN WEDGE 5F 110CM (CATHETERS) IMPLANT
GLIDESHEATH SLEND SS 6F .021 (SHEATH) IMPLANT
GUIDEWIRE .025 260CM (WIRE) IMPLANT
GUIDEWIRE INQWIRE 1.5J.035X260 (WIRE) IMPLANT
PACK CARDIAC CATHETERIZATION (CUSTOM PROCEDURE TRAY) ×1 IMPLANT
SET ATX-X65L (MISCELLANEOUS) IMPLANT
SHEATH GLIDE SLENDER 4/5FR (SHEATH) IMPLANT
SHEATH PINNACLE 5F 10CM (SHEATH) IMPLANT
SHEATH PROBE COVER 6X72 (BAG) IMPLANT

## 2023-12-27 NOTE — H&P (View-Only) (Signed)
 Cardiology Progress Note  Patient ID: Gary Lawrence MRN: [id number] DOB: [birth year] Date of Encounter: 12/27/2023 Primary Cardiologist: None  Subjective   Chief Complaint: SOB  HPI: L/RHC planned today.   ROS:  All other ROS reviewed and negative. Pertinent positives noted in the HPI.     Telemetry  Overnight telemetry shows SR  60s, which I personally reviewed.     Physical Exam   Vitals:   12/27/23 0600 12/27/23 0630 12/27/23 0643 12/27/23 0752  BP:  121/81 121/81 115/81  Pulse:  69 64 (!) 59  Resp:   20 15  Temp:  98.1 F (36.7 C) 98.1 F (36.7 C) (!) 96.9 F (36.1 C)  TempSrc:  Oral Oral Axillary  SpO2:  97% 99% 100%  Weight: 99 kg     Height:        Intake/Output Summary (Last 24 hours) at 12/27/2023 0834 Last data filed at 12/27/2023 0600 Gross per 24 hour  Intake 646.32 ml  Output 2250 ml  Net -1603.68 ml       12/27/2023    6:00 AM 12/26/2023    5:29 AM 12/25/2023   11:19 PM  Last 3 Weights  Weight (lbs) 218 lb 4.1 oz 221 lb 1.9 oz 221 lb 1.9 oz  Weight (kg) 99 kg 100.3 kg 100.3 kg    Body mass index is 24.59 kg/m.  General: Well nourished, well developed, in no acute distress Head: Atraumatic, normal size  Eyes: PEERLA, EOMI  Neck: Supple, no JVD Endocrine: No thryomegaly Cardiac: Normal S1, S2; RRR; no murmurs, rubs, or gallops Lungs: Clear to auscultation bilaterally, no wheezing, rhonchi or rales  Abd: Soft, nontender, no hepatomegaly  Ext: No edema, pulses 2+ Musculoskeletal: No deformities, BUE and BLE strength normal and equal Skin: Warm and dry, no rashes   Neuro: Alert and oriented to person, place, time, and situation, CNII-XII grossly intact, no focal deficits  Psych: Normal mood and affect   Cardiac Studies  TTE 12/25/2023  1. Left ventricular ejection fraction, by estimation, is 35 to 40%. The  left ventricle has moderately decreased function. The left ventricle  demonstrates global hypokinesis. The left ventricular internal cavity size   was mildly dilated. There is mild  concentric left ventricular hypertrophy. Left ventricular diastolic  parameters are consistent with Grade I diastolic dysfunction (impaired  relaxation). The average left ventricular global longitudinal strain is  -6.7 %. The global longitudinal strain is  abnormal.   2. Right ventricular systolic function is normal. The right ventricular  size is normal. Tricuspid regurgitation signal is inadequate for assessing  PA pressure.   3. Left atrial size was moderately dilated.   4. The mitral valve is normal in structure. Mild to moderate mitral valve  regurgitation. No evidence of mitral stenosis.   5. The aortic valve is normal in structure. Aortic valve regurgitation is  not visualized. Aortic valve sclerosis is present, with no evidence of  aortic valve stenosis.   6. The inferior vena cava is normal in size with greater than 50%  respiratory variability, suggesting right atrial pressure of 3 mmHg.   Patient Profile  Gary Lawrence is a 63 y.o. male with atrial flutter/fib status post ablation, CKD stage IIIa, hypertension, diabetes admitted on 12/24/2023 with acute systolic heart failure and atrial flutter with RVR.  Assessment & Plan   # Acute systolic heart failure, EF 35% - Plan for left and right heart catheterization today to exclude obstructive CAD.  Suspect his cardiomyopathy is related to  arrhythmia.  Risk and benefits explained.  Willing to proceed. - Kidney function is pending today. - Hold further IV diuresis.  Appears euvolemic.  Further titration pending right heart cath. - Maintaining sinus rhythm. - Add digoxin  0.125 mg daily. - Plan for amiodarone  for rhythm control.  400 mg twice daily for 7 days followed by 200 mg daily.  Given low EF rhythm control strategy should be pursued.  Would also benefit from consideration for repeat ablation. - Add Aldactone  12.5 mg daily. - Transition to metoprolol  succinate 25 mg daily. - Add SGLT2  inhibitor and likely Entresto  pending kidney function and cath today. - EF likely 20 to 25% on my review.  We should be cautious with GDMT.  Informed Consent   Shared Decision Making/Informed Consent The risks [stroke (1 in 1000), death (1 in 1000), kidney failure [usually temporary] (1 in 500), bleeding (1 in 200), allergic reaction [possibly serious] (1 in 200)], benefits (diagnostic support and management of coronary artery disease) and alternatives of a cardiac catheterization were discussed in detail with Gary Lawrence and he is willing to proceed.      # Atrial flutter/fibrillation - Status post ablation in 2018.  Has had recurrence of atrial flutter. - Now back in normal rhythm.  Plan for amiodarone  for rhythm control in the setting of low EF.  Would benefit from reevaluation or repeat ablation in the outpatient setting. - Continue amiodarone  for 100 mg twice daily for 7 days followed by 200 mg daily. - On heparin  drip.  Transition to Eliquis  post cath. - Metoprolol  succinate 25 mg daily and digoxin  0.125 mg daily.  # CKD stage IIIa - Kidney function is stable.  Close monitoring.  # Diabetes # Hyperlipidemia - Continue statin.  Insulin  management per hospital team.  # Hypertension - We are adjusting medications as above.     For questions or updates, please contact [facility] Please consult [email or web address] for contact info under      Signed, Darryle T. Barbaraann, MD, [facility]  [facility]  12/27/2023 8:34 AM

## 2023-12-27 NOTE — Interval H&P Note (Signed)
 History and Physical Interval Note:  12/27/2023 1:44 PM  Gary Lawrence  has presented today for surgery, with the diagnosis of HF.  The various methods of treatment have been discussed with the patient and family. After consideration of risks, benefits and other options for treatment, the patient has consented to  Procedure(s): RIGHT/LEFT HEART CATH AND CORONARY ANGIOGRAPHY (N/A) as a surgical intervention.  The patient's history has been reviewed, patient examined, no change in status, stable for surgery.  I have reviewed the patient's chart and labs.  Questions were answered to the patient's satisfaction.    Cath Lab Visit (complete for each Cath Lab visit)  Clinical Evaluation Leading to the Procedure:   ACS: No.  Non-ACS:    Anginal Classification: CCS II  Anti-ischemic medical therapy: No Therapy  Non-Invasive Test Results: No non-invasive testing performed  Prior CABG: No previous CABG        Lonni Cash

## 2023-12-27 NOTE — Progress Notes (Signed)
 Patient received from Cathlab, V/S stable, educated for bedrest till 6pm, right radial and right femoral sites are C/D/I, al needs met, call bell in reach.   12/27/23 1621  Vitals  Temp 97.7 F (36.5 C)  Temp Source Oral  BP 133/78  MAP (mmHg) 95  BP Location Right Arm  BP Method Automatic  Patient Position (if appropriate) Lying  Pulse Rate 65  Pulse Rate Source Monitor  ECG Heart Rate 65  Resp 13  Level of Consciousness  Level of Consciousness Alert  MEWS COLOR  MEWS Score Color Green  Oxygen Therapy  SpO2 97 %  O2 Device Room Air  Pain Assessment  Pain Scale 0-10  Pain Score 0  MEWS Score  MEWS Temp 0  MEWS Systolic 0  MEWS Pulse 0  MEWS RR 1  MEWS LOC 0  MEWS Score 1

## 2023-12-27 NOTE — Progress Notes (Signed)
 Cardiology Progress Note  Patient ID: Gary Lawrence MRN: [id number] DOB: [birth year] Date of Encounter: 12/27/2023 Primary Cardiologist: None  Subjective   Chief Complaint: SOB  HPI: L/RHC planned today.   ROS:  All other ROS reviewed and negative. Pertinent positives noted in the HPI.     Telemetry  Overnight telemetry shows SR  60s, which I personally reviewed.     Physical Exam   Vitals:   12/27/23 0600 12/27/23 0630 12/27/23 0643 12/27/23 0752  BP:  121/81 121/81 115/81  Pulse:  69 64 (!) 59  Resp:   20 15  Temp:  98.1 F (36.7 C) 98.1 F (36.7 C) (!) 96.9 F (36.1 C)  TempSrc:  Oral Oral Axillary  SpO2:  97% 99% 100%  Weight: 99 kg     Height:        Intake/Output Summary (Last 24 hours) at 12/27/2023 0834 Last data filed at 12/27/2023 0600 Gross per 24 hour  Intake 646.32 ml  Output 2250 ml  Net -1603.68 ml       12/27/2023    6:00 AM 12/26/2023    5:29 AM 12/25/2023   11:19 PM  Last 3 Weights  Weight (lbs) 218 lb 4.1 oz 221 lb 1.9 oz 221 lb 1.9 oz  Weight (kg) 99 kg 100.3 kg 100.3 kg    Body mass index is 24.59 kg/m.  General: Well nourished, well developed, in no acute distress Head: Atraumatic, normal size  Eyes: PEERLA, EOMI  Neck: Supple, no JVD Endocrine: No thryomegaly Cardiac: Normal S1, S2; RRR; no murmurs, rubs, or gallops Lungs: Clear to auscultation bilaterally, no wheezing, rhonchi or rales  Abd: Soft, nontender, no hepatomegaly  Ext: No edema, pulses 2+ Musculoskeletal: No deformities, BUE and BLE strength normal and equal Skin: Warm and dry, no rashes   Neuro: Alert and oriented to person, place, time, and situation, CNII-XII grossly intact, no focal deficits  Psych: Normal mood and affect   Cardiac Studies  TTE 12/25/2023  1. Left ventricular ejection fraction, by estimation, is 35 to 40%. The  left ventricle has moderately decreased function. The left ventricle  demonstrates global hypokinesis. The left ventricular internal cavity size   was mildly dilated. There is mild  concentric left ventricular hypertrophy. Left ventricular diastolic  parameters are consistent with Grade I diastolic dysfunction (impaired  relaxation). The average left ventricular global longitudinal strain is  -6.7 %. The global longitudinal strain is  abnormal.   2. Right ventricular systolic function is normal. The right ventricular  size is normal. Tricuspid regurgitation signal is inadequate for assessing  PA pressure.   3. Left atrial size was moderately dilated.   4. The mitral valve is normal in structure. Mild to moderate mitral valve  regurgitation. No evidence of mitral stenosis.   5. The aortic valve is normal in structure. Aortic valve regurgitation is  not visualized. Aortic valve sclerosis is present, with no evidence of  aortic valve stenosis.   6. The inferior vena cava is normal in size with greater than 50%  respiratory variability, suggesting right atrial pressure of 3 mmHg.   Patient Profile  Gary Lawrence is a 63 y.o. male with atrial flutter/fib status post ablation, CKD stage IIIa, hypertension, diabetes admitted on 12/24/2023 with acute systolic heart failure and atrial flutter with RVR.  Assessment & Plan   # Acute systolic heart failure, EF 35% - Plan for left and right heart catheterization today to exclude obstructive CAD.  Suspect his cardiomyopathy is related to  arrhythmia.  Risk and benefits explained.  Willing to proceed. - Kidney function is pending today. - Hold further IV diuresis.  Appears euvolemic.  Further titration pending right heart cath. - Maintaining sinus rhythm. - Add digoxin  0.125 mg daily. - Plan for amiodarone  for rhythm control.  400 mg twice daily for 7 days followed by 200 mg daily.  Given low EF rhythm control strategy should be pursued.  Would also benefit from consideration for repeat ablation. - Add Aldactone  12.5 mg daily. - Transition to metoprolol  succinate 25 mg daily. - Add SGLT2  inhibitor and likely Entresto  pending kidney function and cath today. - EF likely 20 to 25% on my review.  We should be cautious with GDMT.  Informed Consent   Shared Decision Making/Informed Consent The risks [stroke (1 in 1000), death (1 in 1000), kidney failure [usually temporary] (1 in 500), bleeding (1 in 200), allergic reaction [possibly serious] (1 in 200)], benefits (diagnostic support and management of coronary artery disease) and alternatives of a cardiac catheterization were discussed in detail with Gary Lawrence and he is willing to proceed.      # Atrial flutter/fibrillation - Status post ablation in 2018.  Has had recurrence of atrial flutter. - Now back in normal rhythm.  Plan for amiodarone  for rhythm control in the setting of low EF.  Would benefit from reevaluation or repeat ablation in the outpatient setting. - Continue amiodarone  for 100 mg twice daily for 7 days followed by 200 mg daily. - On heparin  drip.  Transition to Eliquis  post cath. - Metoprolol  succinate 25 mg daily and digoxin  0.125 mg daily.  # CKD stage IIIa - Kidney function is stable.  Close monitoring.  # Diabetes # Hyperlipidemia - Continue statin.  Insulin  management per hospital team.  # Hypertension - We are adjusting medications as above.     For questions or updates, please contact [facility] Please consult [email or web address] for contact info under      Signed, Gary T. Barbaraann, MD, [facility]  [facility]  12/27/2023 8:34 AM

## 2023-12-27 NOTE — Progress Notes (Signed)
 TR band removed, transparent dressing applied and educated to keep the dressing on for 24 hours, dressing is clean, dry and intact.

## 2023-12-27 NOTE — Progress Notes (Signed)
 PHARMACY - ANTICOAGULATION CONSULT NOTE  Pharmacy Consult for Heparin  (Xarelto  on hold) Indication: atrial fibrillation  Allergies  Allergen Reactions   Other Cough    Unknown to patient   Milk (Cow)     Other reaction(s): VOMITING  Other Reaction(s): GI intolerance    Patient Measurements: Height: 6' 7 (200.7 cm) Weight: 99 kg (218 lb 4.1 oz) IBW/kg (Calculated) : 93.7 HEPARIN  DW (KG): 103.9  Vital Signs: Temp: 96.9 F (36.1 C) (09/02 0752) Temp Source: Axillary (09/02 0752) BP: 115/81 (09/02 0752) Pulse Rate: 59 (09/02 0752)  Labs: Recent Labs    12/25/23 0437 12/25/23 0857 12/26/23 0521 12/26/23 1135 12/26/23 1808 12/27/23 0826  HGB 12.6*  --  12.3*  --   --  12.3*  HCT 38.5*  --  36.5*  --   --  37.4*  PLT 281  --  255  --   --  274  APTT  --    < > 98* 119* 95* 79*  HEPARINUNFRC  --    < > 0.65 0.98* 0.62 0.47  CREATININE 1.42*  --  1.45*  --   --  1.45*   < > = values in this interval not displayed.    Estimated Creatinine Clearance: 67.3 mL/min (A) (by C-G formula based on SCr of 1.45 mg/dL (H)).  Assessment: 63 yr male admitted with chest pain noted likely due to AFlutter with RVR and CHF.  PMH significant for AFib/flutter, CAD w/ stent, DM, HTN. Patient on low-dose Xarelto  prior to admit and reported taking 10 mg daily.    Pharmacy consulted to transition from Xarelto  to heparin  drip. Will utilize aPTT for dosing adjustments until heparin  level and aPTT correlate.  Last Xarelto  doses 8/30 (10 mg PTA at 0730 and 10 mg at [facility] at 2155).  aPTT therapeutic (79 seconds) and heparin  level also therapeutic (0.47) on heparin  at 1400 units/hr.  CBC stable.  For right and left-heart cath today.  Goal of Therapy:  Heparin  level 0.3-0.7 units/ml aPTT 66-102 seconds Monitor platelets by anticoagulation protocol: Yes   Plan:  Continue heparin  drip at 1400 units/hr Daily heparin  level and CBC while on heparin ; stop aPTTs since now correlating. Xarelto  on  hold. Follow up post-cath.  Genaro Zebedee Calin, RPh 12/27/2023,10:40 AM

## 2023-12-27 NOTE — Progress Notes (Signed)
 PROGRESS NOTE    Gary Lawrence  FMW:[id number] DOB: [birth year] DOA: 12/24/2023 PCP: [name], FNP   Brief Narrative:  This 63 yrs old male with PMH significant for CAD /remote stent, A-fib/flutter on low-dose Xarelto , oropharyngeal cancer, HTN, GERD, NIDDM-2 and OSA on CPAP presented in the ED with progressive dizziness, chest pain and shortness of breath.  Initially presented to urgent care but sent to ED for further evaluation.  Patient was stung by hornets a week prior to admission.  Ever since he has been feeling poorly. He also developed shortness of breath.  In the emergency department he was noted to be in atrial flutter with RVR.  BNP was noted to be elevated.  He was hospitalized for further management.  Cardiology evaluation completed recommended transfer patient to [facility] for possible left heart cath.  Assessment & Plan:   Principal Problem:   Chest pain Active Problems:   Atrial flutter with rapid ventricular response (HCC)   HFrEF (heart failure with reduced ejection fraction) (HCC)  Atrial flutter with RVR: Status post ablation in 2018.  Patient has had recurrence of atrial flutter. Current RVR may have been induced by recent hornet stings. Heart rate seems to be poorly controlled.   Continue metoprolol  and Xarelto .  Started on digoxin  0.25 mg daily Evaluated by cardiology.  He has been transitioned from Xarelto  to IV heparin .   Monitor on telemetry. Echo completed, showed LVEF 35-40%,  LV had decreased systolic function. Heart rate is now controlled.  Continue amiodarone  400 mg twice daily for 7 days followed by 200 mg daily.   Acute systolic CHF: Echo shows LVEF 35 to 40%, decreased LV function. Continue furosemide  40 mg IV daily.  Monitor daily intake / output charting, daily weight. Chest x-ray showed bilateral small pleural effusions. Plan for left and right heart catheterization today to exclude obstructive CAD. Suspect could be due to  cardiomyopathy.   Chest pain / dyspnea / lightheadedness: Most likely due to atrial flutter with RVR.   Troponins were only mildly elevated.  CHF also contributing. Xarelto  has been transitioned to heparin  by cardiology.   Plan is for right and left heart cath today to exclude obstructive CAD.   History of coronary artery disease: Remote stent in the past.  Continue metoprolol  and statin.   Diabetes mellitus type 2, controlled: HbA1c is 6.5.  Monitor CBGs.   He is just on oral medications prior to admission.   Currently on SSI.   Obstructive sleep apnea: CPAP at nighttime.  CKD stage IIIb: Baseline serum creatinine around 1.2-1.4. Monitor serum creatinine while on Lasix .   DVT prophylaxis: IV Heparin . Code Status: Full code Family Communication: No family at bed side. Disposition Plan:    Status is: Inpatient Remains inpatient appropriate because:  Patient is scheduled for left and right heart cath to exclude obstructive CAD today.    Consultants:  Cardiology  Procedures: Echocardiogram Antimicrobials: Anti-infectives (From admission, onward)    None       Subjective: Patient was seen and examined at bedside.  Overnight events noted. Patient reports feeling much improved.  Patient denies any chest pain.   Heart rate is well-controlled now.  Patient is scheduled for right and left heart cath today.  Objective: Vitals:   12/27/23 0600 12/27/23 0630 12/27/23 0643 12/27/23 0752  BP:  121/81 121/81 115/81  Pulse:  69 64 (!) 59  Resp:   20 15  Temp:  98.1 F (36.7 C) 98.1 F (36.7 C) (!)  96.9 F (36.1 C)  TempSrc:  Oral Oral Axillary  SpO2:  97% 99% 100%  Weight: 99 kg     Height:        Intake/Output Summary (Last 24 hours) at 12/27/2023 1107 Last data filed at 12/27/2023 0900 Gross per 24 hour  Intake 646.32 ml  Output 1550 ml  Net -903.68 ml   Filed Weights   12/25/23 2319 12/26/23 0529 12/27/23 0600  Weight: 100.3 kg 100.3 kg 99 kg     Examination:  General exam: Appears calm and comfortable, not in any acute distress. Respiratory system: CTA bilaterally. Respiratory effort normal.  RR 14 Cardiovascular system: S1 & S2 heard, Irregular rhythm, no JVD, murmurs, rubs, gallops or clicks.  Gastrointestinal system: Abdomen is non distended, soft and non tender.  Normal bowel sounds heard. Central nervous system: Alert and oriented x 3. No focal neurological deficits. Extremities: No edema, no cyanosis, no clubbing Skin: No rashes, lesions or ulcers Psychiatry: Judgement and insight appear normal. Mood & affect appropriate.     Data Reviewed: I have personally reviewed following labs and imaging studies  CBC: Recent Labs  Lab 12/24/23 1128 12/25/23 0437 12/26/23 0521 12/27/23 0826  WBC 4.1 4.9 4.7 3.7*  NEUTROABS 2.4  --   --   --   HGB 12.5* 12.6* 12.3* 12.3*  HCT 40.1 38.5* 36.5* 37.4*  MCV 86.1 84.2 80.8 82.0  PLT 249 281 255 274   Basic Metabolic Panel: Recent Labs  Lab 12/24/23 1128 12/25/23 0437 12/25/23 1207 12/26/23 0521 12/27/23 0826  NA 142 143  --  139 137  K 4.4 4.0  --  3.7 4.0  CL 107 104  --  101 101  CO2 25 28  --  28 28  GLUCOSE 132* 102*  --  149* 124*  BUN 18 19  --  22 23  CREATININE 1.27* 1.42*  --  1.45* 1.45*  CALCIUM  9.6 10.1  --  9.6 9.2  MG  --  1.5* 1.2*  --  1.7  PHOS  --   --   --   --  4.0   GFR: Estimated Creatinine Clearance: 67.3 mL/min (A) (by C-G formula based on SCr of 1.45 mg/dL (H)). Liver Function Tests: Recent Labs  Lab 12/26/23 0521 12/27/23 0826  AST 28 20  ALT 45* 37  ALKPHOS 45 42  BILITOT 0.6 0.6  PROT 6.0* 6.1*  ALBUMIN 3.2* 3.2*   No results for input(s): LIPASE, AMYLASE in the last 168 hours. No results for input(s): AMMONIA in the last 168 hours. Coagulation Profile: No results for input(s): INR, PROTIME in the last 168 hours. Cardiac Enzymes: No results for input(s): CKTOTAL, CKMB, CKMBINDEX, TROPONINI in the last  168 hours. BNP (last 3 results) Recent Labs    12/24/23 1507  PROBNP 3,163.0*   HbA1C: Recent Labs    12/24/23 1128  HGBA1C 6.5*   CBG: Recent Labs  Lab 12/26/23 0620 12/26/23 1144 12/26/23 1714 12/26/23 2105 12/27/23 0624  GLUCAP 126* 144* 92 80 110*   Lipid Profile: Recent Labs    12/26/23 0521  CHOL 122  HDL 47  LDLCALC 60  TRIG 73  CHOLHDL 2.6   Thyroid Function Tests: Recent Labs    12/27/23 0826  TSH 1.637   Anemia Panel: No results for input(s): VITAMINB12, FOLATE, FERRITIN, TIBC, IRON, RETICCTPCT in the last 72 hours. Sepsis Labs: No results for input(s): PROCALCITON, LATICACIDVEN in the last 168 hours.  No results found for this or  any previous visit (from the past 240 hours).   Radiology Studies: ECHOCARDIOGRAM COMPLETE Result Date: 12/25/2023    ECHOCARDIOGRAM REPORT   Patient Name:   JARIN CORNFIELD Date of Exam: 12/25/2023 Medical Rec #:  [id number]      Height:       79.0 in Accession #:    [id number]     Weight:       229.0 lb Date of Birth:  1961/02/17       BSA:          2.414 m Patient Age:    63 years       BP:           122/88 mmHg Patient Gender: M              HR:           68 bpm. Exam Location:  Inpatient Procedure: Cardiac Doppler, Color Doppler and Strain Analysis (Both Spectral and            Color Flow Doppler were utilized during procedure). Indications:    Chest Pain R07.9  History:        Patient has no prior history of Echocardiogram examinations.                 Arrythmias:Atrial Fibrillation and Atrial Flutter; Risk                 Factors:Hypertension, Diabetes and Dyslipidemia.  Sonographer:    Aida Pizza RCS Referring Phys: [id number] MIGNON DASEN GONFA  Sonographer Comments: Global longitudinal strain was attempted. IMPRESSIONS  1. Left ventricular ejection fraction, by estimation, is 35 to 40%. The left ventricle has moderately decreased function. The left ventricle demonstrates global hypokinesis. The left ventricular  internal cavity size was mildly dilated. There is mild concentric left ventricular hypertrophy. Left ventricular diastolic parameters are consistent with Grade I diastolic dysfunction (impaired relaxation). The average left ventricular global longitudinal strain is -6.7 %. The global longitudinal strain is abnormal.  2. Right ventricular systolic function is normal. The right ventricular size is normal. Tricuspid regurgitation signal is inadequate for assessing PA pressure.  3. Left atrial size was moderately dilated.  4. The mitral valve is normal in structure. Mild to moderate mitral valve regurgitation. No evidence of mitral stenosis.  5. The aortic valve is normal in structure. Aortic valve regurgitation is not visualized. Aortic valve sclerosis is present, with no evidence of aortic valve stenosis.  6. The inferior vena cava is normal in size with greater than 50% respiratory variability, suggesting right atrial pressure of 3 mmHg. FINDINGS  Left Ventricle: Left ventricular ejection fraction, by estimation, is 35 to 40%. The left ventricle has moderately decreased function. The left ventricle demonstrates global hypokinesis. The average left ventricular global longitudinal strain is -6.7 %.  Strain was performed and the global longitudinal strain is abnormal. Global longitudinal strain performed but not reported based on interpreter judgement due to suboptimal tracking. The left ventricular internal cavity size was mildly dilated. There is mild concentric left ventricular hypertrophy. Left ventricular diastolic parameters are consistent with Grade I diastolic dysfunction (impaired relaxation). Right Ventricle: The right ventricular size is normal. No increase in right ventricular wall thickness. Right ventricular systolic function is normal. Tricuspid regurgitation signal is inadequate for assessing PA pressure. Left Atrium: Left atrial size was moderately dilated. Right Atrium: Right atrial size was normal in  size. Pericardium: There is no evidence of pericardial effusion. Mitral Valve: The mitral valve is normal in  structure. Mild to moderate mitral valve regurgitation. No evidence of mitral valve stenosis. Tricuspid Valve: The tricuspid valve is normal in structure. Tricuspid valve regurgitation is not demonstrated. No evidence of tricuspid stenosis. Aortic Valve: The aortic valve is normal in structure. Aortic valve regurgitation is not visualized. Aortic valve sclerosis is present, with no evidence of aortic valve stenosis. Aortic valve mean gradient measures 2.0 mmHg. Aortic valve peak gradient measures 3.7 mmHg. Aortic valve area, by VTI measures 2.43 cm. Pulmonic Valve: The pulmonic valve was normal in structure. Pulmonic valve regurgitation is not visualized. No evidence of pulmonic stenosis. Aorta: The aortic root is normal in size and structure. Venous: The inferior vena cava is normal in size with greater than 50% respiratory variability, suggesting right atrial pressure of 3 mmHg. IAS/Shunts: No atrial level shunt detected by color flow Doppler.  LEFT VENTRICLE PLAX 2D LVIDd:         5.89 cm   Diastology LVIDs:         4.82 cm   LV e' medial:    5.77 cm/s LV PW:         1.07 cm   LV E/e' medial:  15.9 LV IVS:        1.03 cm   LV e' lateral:   11.00 cm/s LVOT diam:     2.07 cm   LV E/e' lateral: 8.3 LV SV:         44 LV SV Index:   18        2D Longitudinal Strain LVOT Area:     3.37 cm  2D Strain GLS Avg:     -6.7 %                           3D Volume EF:                          3D EF:        43 %                          LV EDV:       353 ml                          LV ESV:       200 ml                          LV SV:        153 ml RIGHT VENTRICLE RV S prime:     12.00 cm/s TAPSE (M-mode): 2.0 cm LEFT ATRIUM              Index        RIGHT ATRIUM           Index LA diam:        4.55 cm  1.88 cm/m   RA Area:     20.50 cm LA Vol (A2C):   109.0 ml 45.15 ml/m  RA Volume:   65.60 ml  27.18 ml/m LA Vol  (A4C):   86.5 ml  35.83 ml/m LA Biplane Vol: 99.2 ml  41.09 ml/m  AORTIC VALVE AV Area (Vmax):    2.64 cm AV Area (Vmean):   2.63 cm AV Area (VTI):     2.43 cm AV Vmax:  95.70 cm/s AV Vmean:          66.200 cm/s AV VTI:            0.183 m AV Peak Grad:      3.7 mmHg AV Mean Grad:      2.0 mmHg LVOT Vmax:         75.00 cm/s LVOT Vmean:        51.700 cm/s LVOT VTI:          0.132 m LVOT/AV VTI ratio: 0.72  AORTA Ao Root diam: 3.38 cm MITRAL VALVE MV Area (PHT): 5.84 cm    SHUNTS MV Decel Time: 130 msec    Systemic VTI:  0.13 m MV E velocity: 91.60 cm/s  Systemic Diam: 2.07 cm MV A velocity: 20.40 cm/s MV E/A ratio:  4.49 Kardie Tobb DO Electronically signed by Kardie Tobb DO Signature Date/Time: 12/25/2023/6:14:51 PM    Final    Scheduled Meds:  amiodarone   400 mg Oral BID   Followed by   NOREEN ON 01/03/2024] amiodarone   200 mg Oral Daily   atorvastatin   40 mg Oral Daily   brimonidine   1 drop Both Eyes Q8H   digoxin   0.125 mg Oral Daily   [START ON 12/28/2023] free water   250 mL Oral Once   insulin  aspart  0-5 Units Subcutaneous QHS   insulin  aspart  0-9 Units Subcutaneous TID WC   metoprolol  succinate  25 mg Oral Daily   pantoprazole   40 mg Oral Daily   spironolactone   12.5 mg Oral Daily   Continuous Infusions:  heparin  1,400 Units/hr (12/26/23 2359)     LOS: 2 days    Time spent: 35 mins    Darcel Dawley, MD [facility]   If 7PM-7AM, please contact night-coverage

## 2023-12-27 NOTE — Progress Notes (Signed)
 5 fr sheath removed at 1535. Patient remained hemodynamically stable throughout. Pressure held 15 min.

## 2023-12-28 ENCOUNTER — Telehealth: Payer: Self-pay | Admitting: Physician Assistant

## 2023-12-28 ENCOUNTER — Other Ambulatory Visit (HOSPITAL_COMMUNITY): Payer: Self-pay

## 2023-12-28 ENCOUNTER — Telehealth (HOSPITAL_COMMUNITY): Payer: Self-pay | Admitting: Pharmacy Technician

## 2023-12-28 DIAGNOSIS — R079 Chest pain, unspecified: Secondary | ICD-10-CM | POA: Diagnosis not present

## 2023-12-28 DIAGNOSIS — I4819 Other persistent atrial fibrillation: Secondary | ICD-10-CM

## 2023-12-28 LAB — BASIC METABOLIC PANEL WITH GFR
Anion gap: 12 (ref 5–15)
BUN: 25 mg/dL — ABNORMAL HIGH (ref 8–23)
CO2: 26 mmol/L (ref 22–32)
Calcium: 9.1 mg/dL (ref 8.9–10.3)
Chloride: 102 mmol/L (ref 98–111)
Creatinine, Ser: 1.58 mg/dL — ABNORMAL HIGH (ref 0.61–1.24)
GFR, Estimated: 48 mL/min — ABNORMAL LOW (ref >60)
Glucose, Bld: 141 mg/dL — ABNORMAL HIGH (ref 70–99)
Potassium: 4.1 mmol/L (ref 3.5–5.1)
Sodium: 140 mmol/L (ref 135–145)

## 2023-12-28 LAB — POCT I-STAT EG7
Acid-Base Excess: 4 mmol/L — ABNORMAL HIGH (ref 0.0–2.0)
Bicarbonate: 30 mmol/L — ABNORMAL HIGH (ref 20.0–28.0)
Calcium, Ion: 1.3 mmol/L (ref 1.15–1.40)
HCT: 38 % — ABNORMAL LOW (ref 39.0–52.0)
Hemoglobin: 12.9 g/dL — ABNORMAL LOW (ref 13.0–17.0)
O2 Saturation: 65 %
Potassium: 3.9 mmol/L (ref 3.5–5.1)
Sodium: 140 mmol/L (ref 135–145)
TCO2: 31 mmol/L (ref 22–32)
pCO2, Ven: 48.3 mmHg (ref 44–60)
pH, Ven: 7.401 (ref 7.25–7.43)
pO2, Ven: 34 mmHg (ref 32–45)

## 2023-12-28 LAB — CBC
HCT: 38.5 % — ABNORMAL LOW (ref 39.0–52.0)
Hemoglobin: 12.8 g/dL — ABNORMAL LOW (ref 13.0–17.0)
MCH: 27.4 pg (ref 26.0–34.0)
MCHC: 33.2 g/dL (ref 30.0–36.0)
MCV: 82.3 fL (ref 80.0–100.0)
Platelets: 271 K/uL (ref 150–400)
RBC: 4.68 MIL/uL (ref 4.22–5.81)
RDW: 13.5 % (ref 11.5–15.5)
WBC: 5.1 K/uL (ref 4.0–10.5)
nRBC: 0 % (ref 0.0–0.2)

## 2023-12-28 LAB — GLUCOSE, CAPILLARY: Glucose-Capillary: 145 mg/dL — ABNORMAL HIGH (ref 70–99)

## 2023-12-28 LAB — POCT ACTIVATED CLOTTING TIME: Activated Clotting Time: 193 s

## 2023-12-28 MED ORDER — APIXABAN 5 MG PO TABS
5.0000 mg | ORAL_TABLET | Freq: Two times a day (BID) | ORAL | Status: DC
  Administered 2023-12-28: 5 mg via ORAL
  Filled 2023-12-28: qty 1

## 2023-12-28 MED ORDER — LIDOCAINE 5 % EX PTCH
2.0000 | MEDICATED_PATCH | CUTANEOUS | Status: DC
  Administered 2023-12-28: 2 via TRANSDERMAL
  Filled 2023-12-28: qty 2

## 2023-12-28 MED ORDER — DIGOXIN 125 MCG PO TABS
0.1250 mg | ORAL_TABLET | Freq: Every day | ORAL | 0 refills | Status: DC
  Filled 2023-12-28: qty 30, 30d supply, fill #0

## 2023-12-28 MED ORDER — SPIRONOLACTONE 25 MG PO TABS
12.5000 mg | ORAL_TABLET | Freq: Every day | ORAL | 0 refills | Status: DC
Start: 2023-12-28 — End: 2024-01-31
  Filled 2023-12-28: qty 30, 60d supply, fill #0

## 2023-12-28 MED ORDER — AMIODARONE HCL 200 MG PO TABS
ORAL_TABLET | ORAL | 0 refills | Status: DC
  Filled 2023-12-28: qty 60, 30d supply, fill #0

## 2023-12-28 MED ORDER — APIXABAN 5 MG PO TABS
5.0000 mg | ORAL_TABLET | Freq: Two times a day (BID) | ORAL | 0 refills | Status: DC
  Filled 2023-12-28: qty 60, 30d supply, fill #0

## 2023-12-28 MED ORDER — SACUBITRIL-VALSARTAN 24-26 MG PO TABS
1.0000 | ORAL_TABLET | Freq: Two times a day (BID) | ORAL | 0 refills | Status: DC
Start: 2023-12-28 — End: 2024-01-31
  Filled 2023-12-28: qty 60, 30d supply, fill #0

## 2023-12-28 MED ORDER — SACUBITRIL-VALSARTAN 24-26 MG PO TABS
1.0000 | ORAL_TABLET | Freq: Two times a day (BID) | ORAL | Status: DC
  Administered 2023-12-28: 1 via ORAL
  Filled 2023-12-28: qty 1

## 2023-12-28 MED ORDER — METOPROLOL SUCCINATE ER 25 MG PO TB24
25.0000 mg | ORAL_TABLET | Freq: Every day | ORAL | 0 refills | Status: DC
  Filled 2023-12-28: qty 30, 30d supply, fill #0

## 2023-12-28 MED ORDER — EMPAGLIFLOZIN 10 MG PO TABS
10.0000 mg | ORAL_TABLET | Freq: Every day | ORAL | 0 refills | Status: DC
  Filled 2023-12-28: qty 30, 30d supply, fill #0

## 2023-12-28 MED ORDER — LIDOCAINE 5 % EX PTCH
1.0000 | MEDICATED_PATCH | CUTANEOUS | 0 refills | Status: AC
  Filled 2023-12-28: qty 30, 30d supply, fill #0

## 2023-12-28 NOTE — Plan of Care (Signed)
  Problem: Skin Integrity: Goal: Risk for impaired skin integrity will decrease Outcome: Progressing   Problem: Education: Goal: Knowledge of General Education information will improve Description: Including pain rating scale, medication(s)/side effects and non-pharmacologic comfort measures Outcome: Progressing   Problem: Coping: Goal: Level of anxiety will decrease Outcome: Progressing   Problem: Pain Managment: Goal: General experience of comfort will improve and/or be controlled Outcome: Progressing   Problem: Safety: Goal: Ability to remain free from injury will improve Outcome: Progressing

## 2023-12-28 NOTE — Progress Notes (Addendum)
 Per verbal discussion with Dr. Barbaraann, f/u with APP arranged 9/12 and msg sent to his nurse to help with 1-2 month follow-up. Dr. Barbaraann also relayed recommendations to IM team (per chat He will need a work note. He cannot lift more than 15 lbs for the next 1 week. After that no restrictions. He also wants lidocaine  patches at DC). Added post cath dotphrase instructions including MD lifting recommendation on AVS.   Addendum: after DC received msg from pharmD that they would suggest checking digoxin  level at follow-up appointment. I will send a message to the office to call patient to have patient hold off taking AM dose of digoxin  that day so that labs can be checked (will defer to APP seeing patient in follow-up to order in case other labs are requested at that visit), should be OK to take the rx after labs drawn. Also added FYI to appt notes.

## 2023-12-28 NOTE — Progress Notes (Signed)
 DC order noted per MD. DC RN at bedside with patient/wife, agreeable with discharge. Noted patient need for work note, primary RN obtained copies x3 and provided to patient as requested. PIVs removed. AVS printed/reviewed with thorough instruction on postop care. CP/Edu resolved. All belonings accounted for. No home meds. Telemonitor returned to charging station. TOC meds picked up from pharmacy. Patient wheeled out with spouse/pastor friend in private vehicle.

## 2023-12-28 NOTE — Telephone Encounter (Signed)
 Hi Gary Lawrence - I wasn't sure with the new rules to avoid sending things to triage applied to hospital patients. This is a patient discharged today from the hospital whom pharmacist from [facility] just reached out. They would recommend getting a digoxin  level when he comes in for 01/06/24 OV. I added this to the appt notes so that [facility] is aware to order (will defer to him to order at OV in case other labs needed at that time). In order for the value to be accurate, can you ask the patient to hold off taking his digoxin  the AM of his OV so that the level is accurate when drawn? He will be able to take it as soon as the OV is over/labs are drawn so can bring it with him, just should hold off taking that specific morning. Thank you!  I'm going to cc to [facility] just to be aware to order at upcoming OV when he meets pt (also in appt notes).

## 2023-12-28 NOTE — Progress Notes (Signed)
 Heart Failure Navigator Progress Note  Assessed for Heart & Vascular TOC clinic readiness.  Patient does not meet criteria due to has a scheduled [facility] appointment on 9/112/2025. No HF TOC.   Navigator will sign off at this time.   Stephane Haddock, BSN, Scientist, clinical (histocompatibility and immunogenetics) Only

## 2023-12-28 NOTE — Progress Notes (Signed)
 Cardiology Progress Note  Patient ID: Gary Lawrence MRN: [id number] DOB: 1961/01/07 Date of Encounter: 12/28/2023 Primary Cardiologist: None  Subjective   Chief Complaint: SOB  HPI: No complaints. Euvolemic on exam.   ROS:  All other ROS reviewed and negative. Pertinent positives noted in the HPI.     Telemetry  Overnight telemetry shows SR 60s, which I personally reviewed.   Physical Exam   Vitals:   12/27/23 2034 12/28/23 0027 12/28/23 0500 12/28/23 0525  BP: 130/86 124/71  117/60  Pulse:    (!) 57  Resp: 18   15  Temp: 98.1 F (36.7 C) (!) 97.5 F (36.4 C)  98 F (36.7 C)  TempSrc: Oral Oral    SpO2:    100%  Weight:   98.1 kg   Height:        Intake/Output Summary (Last 24 hours) at 12/28/2023 0735 Last data filed at 12/27/2023 2235 Gross per 24 hour  Intake 240 ml  Output 1800 ml  Net -1560 ml       12/28/2023    5:00 AM 12/27/2023    6:00 AM 12/26/2023    5:29 AM  Last 3 Weights  Weight (lbs) 216 lb 4.3 oz 218 lb 4.1 oz 221 lb 1.9 oz  Weight (kg) 98.1 kg 99 kg 100.3 kg    Body mass index is 24.36 kg/m.  General: Well nourished, well developed, in no acute distress Head: Atraumatic, normal size  Eyes: PEERLA, EOMI  Neck: Supple, no JVD Endocrine: No thryomegaly Cardiac: Normal S1, S2; RRR; no murmurs, rubs, or gallops Lungs: Clear to auscultation bilaterally, no wheezing, rhonchi or rales  Abd: Soft, nontender, no hepatomegaly  Ext: No edema, pulses 2+, 2+ right radial cath site, 2+ femoral pulse Musculoskeletal: No deformities, BUE and BLE strength normal and equal Skin: Warm and dry, no rashes   Neuro: Alert and oriented to person, place, time, and situation, CNII-XII grossly intact, no focal deficits  Psych: Normal mood and affect   Cardiac Studies  RHC/LHC 12/27/2023   Mid RCA lesion is 30% stenosed.   Ramus lesion is 20% stenosed.   Prox Cx lesion is 30% stenosed.   Dist Cx lesion is 30% stenosed.   Mid LAD to Dist LAD lesion is 20% stenosed.    Mild non-obstructive CAD Mild elevation right and left heart pressures (PCWP 19 mmHg, PA mean 25 mmHg, LVEDP 21 mmHg).    Medical management of CAD. Continue diuresis.   TTE 12/25/2023  1. Left ventricular ejection fraction, by estimation, is 35 to 40%. The  left ventricle has moderately decreased function. The left ventricle  demonstrates global hypokinesis. The left ventricular internal cavity size  was mildly dilated. There is mild  concentric left ventricular hypertrophy. Left ventricular diastolic  parameters are consistent with Grade I diastolic dysfunction (impaired  relaxation). The average left ventricular global longitudinal strain is  -6.7 %. The global longitudinal strain is  abnormal.   2. Right ventricular systolic function is normal. The right ventricular  size is normal. Tricuspid regurgitation signal is inadequate for assessing  PA pressure.   3. Left atrial size was moderately dilated.   4. The mitral valve is normal in structure. Mild to moderate mitral valve  regurgitation. No evidence of mitral stenosis.   5. The aortic valve is normal in structure. Aortic valve regurgitation is  not visualized. Aortic valve sclerosis is present, with no evidence of  aortic valve stenosis.   6. The inferior vena cava is normal  in size with greater than 50%  respiratory variability, suggesting right atrial pressure of 3 mmHg.   Patient Profile  Gary Lawrence is a 63 y.o. male with atrial flutter/fib status post ablation, CKD stage IIIa, hypertension, diabetes admitted on 12/24/2023 with acute systolic heart failure and atrial flutter with RVR.   Assessment & Plan   # Acute Systolic HF, 35% - Euvolemic on exam.  No further IV diuresis. - Left heart cath with nonobstructive disease.  Suspect his cardiomyopathy is related to arrhythmia. - Will need to be considered for repeat A-fib ablation.  For now continue amiodarone  for rhythm control in the setting of low EF.  400 mg twice  daily for 7 days followed by 200 mg daily. - Continue digoxin  0.125 mg daily. - Continue metoprolol  succinate 25 mg daily - Continue Aldactone  12.5 mg daily - Continue Jardiance  10 mg daily - Add Entresto  24-26 mg twice daily. - Recommend Lasix  40 mg as needed for lower extremity edema at discharge - I will arrange follow-up with me in 1 to 2 weeks  # Atrial fibrillation - Likely driving his heart failure.   - Continue amiodarone  for 100 mg twice daily for 7 days followed by 200 mg daily.  Plan is for rhythm control strategy.  Likely needs to be considered for repeat ablation in the setting of low EF. - Continue Eliquis  5 mg twice daily.  # Nonobstructive CAD - No significant CAD on cath.  Continue statin.  No need for aspirin  as he is on Eliquis .  # CKD 3a - stable  # DM - per primary team  # HTN - above   [facility] will sign off.   The patient is ready for discharge today from a cardiac standpoint. Medication Recommendations:  As above Other recommendations (labs, testing, etc):  none Follow up as an outpatient:  1-2 weeks with me  For questions or updates, please contact [facility] Please consult [email or web address] for contact info under     Signed, Darryle T. Barbaraann, MD, [facility] [place]  [facility]  12/28/2023 7:35 AM

## 2023-12-28 NOTE — Discharge Summary (Signed)
 Physician Discharge Summary  Gary Lawrence FMW:[id number] DOB: 1960/05/18 DOA: 12/24/2023  PCP: [name], FNP  Admit date: 12/24/2023 Discharge date: 12/28/2023  Admitted From: Home Disposition: Home  Recommendations for Outpatient Follow-up:  Follow up with PCP in 1 week with repeat CBC/BMP Outpatient follow-up with cardiology Follow up in ED if symptoms worsen or new appear   Home Health: No Equipment/Devices: None  Discharge Condition: Stable CODE STATUS: Full Diet recommendation: Heart healthy/carb modified  Brief/Interim Summary: 63 yrs old male with PMH significant for CAD /remote stent, A-fib/flutter on low-dose Xarelto , oropharyngeal cancer, HTN, GERD, NIDDM-2 and OSA on CPAP presented with progressive dizziness, chest pain and shortness of breath.  On presentation, he was found to be in A-fib with RVR with elevated BNP.  Cardiology was consulted.  During the hospitalization, echo showed EF of 35 to 40%.  He was started on rate controlling medications including digoxin  by cardiology.  He is currently also on oral amiodarone .  He underwent cardiac catheterization on 12/27/2023 which showed mild nonobstructive CAD: Cardiology recommended medical management and continue diuresis.  He has subsequently been started on Entresto  by cardiology.  Patient feels much better.  Cardiology has cleared him for discharge.  Outpatient follow-up with cardiology.  Discharge patient home today.  Discharge Diagnoses:   Acute systolic heart failure - Echo showed EF of 35 to 40%.  Treated with IV diuretics by cardiology.  Has diuresed well.  Cardiology has cleared him for discharge. - Cardiology recommending Entresto , Aldactone , Jardiance , metoprolol  succinate, digoxin  on discharge.  Outpatient follow-up with cardiology.  Continue fluid-restriction  Paroxysmal A-fib/flutter with RVR - Has history of ablation in 2018 - Currently rate controlled.  Continue Eliquis , metoprolol  succinate,  digoxin  on discharge.  Continue amiodarone  400 mg twice a day for 7 days then 200 mg once a day as per cardiology recommendations.  Outpatient follow-up with cardiology.  History of CAD and stent Hyperlipidemia - Currently stable.  Cardiac catheterization on 12/27/2023 showed mild nonobstructive CAD.  Cardiology recommended medical management.  Continue statin and fenofibrate  Diabetes mellitus type 2 - A1c 6.5.  Carb modified diet.  Hold metformin till reevaluation by PCP.  Resume other home regimen  GERD -Continue PPI  OSA -Continue CPAP at nighttime  Discharge Instructions  Discharge Instructions     Ambulatory referral to Cardiology   Complete by: As directed    If you have not heard from the Cardiology office within the next 72 hours please call [phone].   Diet - low sodium heart healthy   Complete by: As directed    Diet Carb Modified   Complete by: As directed    Increase activity slowly   Complete by: As directed       Allergies as of 12/28/2023       Reactions   Other Cough   Unknown to patient   Milk (cow)    Other reaction(s): VOMITING Other Reaction(s): GI intolerance        Medication List     STOP taking these medications    amLODipine 5 MG tablet Commonly known as: NORVASC   chlorthalidone 25 MG tablet Commonly known as: HYGROTON   enalapril 10 MG tablet Commonly known as: VASOTEC   ibuprofen  600 MG tablet Commonly known as: ADVIL    metFORMIN 1000 MG tablet Commonly known as: GLUCOPHAGE   methocarbamol  500 MG tablet Commonly known as: ROBAXIN    metoprolol  tartrate 100 MG tablet Commonly known as: LOPRESSOR    rivaroxaban  10 MG Tabs tablet Commonly  known as: XARELTO    Tolnaftate  1 % Aero       TAKE these medications    Accu-Chek Guide test strip Generic drug: glucose blood USE 1 STRIP ONCE DAILY FOR 90 DAYS   amiodarone  200 MG tablet Commonly known as: PACERONE  400mg  BID till 01/02/24 then 200mg  daily from 01/03/24 as per  cardiology recs   apixaban  5 MG Tabs tablet Commonly known as: ELIQUIS  Take 1 tablet (5 mg total) by mouth 2 (two) times daily.   brimonidine  0.15 % ophthalmic solution Commonly known as: ALPHAGAN  Apply 1 drop to eye as directed.   cycloSPORINE 0.05 % ophthalmic emulsion Commonly known as: RESTASIS Place 1 drop into both eyes 2 (two) times daily.   digoxin  0.125 MG tablet Commonly known as: LANOXIN  Take 1 tablet (0.125 mg total) by mouth daily.   diphenhydrAMINE 25 mg capsule Commonly known as: BENADRYL Take 25 mg by mouth every 6 (six) hours as needed.   empagliflozin  10 MG Tabs tablet Commonly known as: JARDIANCE  Take 1 tablet (10 mg total) by mouth daily.   fenofibrate 48 MG tablet Commonly known as: TRICOR Take 48 mg by mouth daily.   ferrous sulfate 324 MG Tbec Take 324 mg by mouth daily.   fluticasone 50 MCG/ACT nasal spray Commonly known as: FLONASE Place 1 spray into both nostrils daily.   glipiZIDE 2.5 MG 24 hr tablet Commonly known as: GLUCOTROL XL Take 2.5 mg by mouth daily with breakfast.   lidocaine  5 % Commonly known as: LIDODERM  Place 1 patch onto the skin daily. Remove & Discard patch within 12 hours or as directed by MD Start taking on: December 29, 2023   loratadine 10 MG tablet Commonly known as: CLARITIN Take 10 mg by mouth daily.   lovastatin 40 MG tablet Commonly known as: MEVACOR Take 40 mg by mouth daily.   metoprolol  succinate 25 MG 24 hr tablet Commonly known as: TOPROL -XL Take 1 tablet (25 mg total) by mouth daily.   pantoprazole  40 MG tablet Commonly known as: PROTONIX  Take 40 mg by mouth daily.   sacubitril -valsartan  24-26 MG Commonly known as: ENTRESTO  Take 1 tablet by mouth 2 (two) times daily.   sildenafil 100 MG tablet Commonly known as: VIAGRA Take 100 mg by mouth as needed.   spironolactone  25 MG tablet Commonly known as: ALDACTONE  Take 0.5 tablets (12.5 mg total) by mouth daily.   Vitamin D3 50 MCG (2000 UT)  Tabs Take 2,000 Units by mouth daily.        Follow-up Information     Rana Lum CROME, NP Follow up.   Specialty: Cardiology Why: Davene Nicolas - [address] location - cardiology follow-up arranged Friday Jan 06, 2024 10:05 AM (Arrive by 9:45 AM). [place] is one of the nurse practitioners that works with Dr. Barbaraann. The office will also contact you about a second follow-up with Dr. Barbaraann in 1-2 months. Contact information: [address] [place] [address] [phone]         [name], FNP. Schedule an appointment as soon as possible for a visit in 1 week(s).   Specialty: Nurse Practitioner Why: with repeat bmp Contact information: [address] [place] [address] [phone]                Allergies  Allergen Reactions   Other Cough    Unknown to patient   Milk (Cow)     Other reaction(s): VOMITING  Other Reaction(s): GI intolerance    Consultations: Cardiology  Procedures/Studies: CARDIAC CATHETERIZATION Result Date: 12/27/2023   Mid RCA lesion is 30% stenosed.   Ramus lesion is 20% stenosed.   Prox Cx lesion is 30% stenosed.   Dist Cx lesion is 30% stenosed.   Mid LAD to Dist LAD lesion is 20% stenosed. Mild non-obstructive CAD Mild elevation right and left heart pressures (PCWP 19 mmHg, PA mean 25 mmHg, LVEDP 21 mmHg). Medical management of CAD. Continue diuresis.   ECHOCARDIOGRAM COMPLETE Result Date: 12/25/2023    ECHOCARDIOGRAM REPORT   Patient Name:   Gary Lawrence Date of Exam: 12/25/2023 Medical Rec #:  [id number]      Height:       79.0 in Accession #:    [id number]     Weight:       229.0 lb Date of Birth:  02-22-1961       BSA:          2.414 m Patient Age:    63 years       BP:           122/88 mmHg Patient Gender: M              HR:           68 bpm. Exam Location:  Inpatient Procedure: Cardiac Doppler, Color Doppler and Strain Analysis (Both Spectral and            Color Flow Doppler were utilized during  procedure). Indications:    Chest Pain R07.9  History:        Patient has no prior history of Echocardiogram examinations.                 Arrythmias:Atrial Fibrillation and Atrial Flutter; Risk                 Factors:Hypertension, Diabetes and Dyslipidemia.  Sonographer:    Aida Pizza RCS Referring Phys: [id number] MIGNON DASEN GONFA  Sonographer Comments: Global longitudinal strain was attempted. IMPRESSIONS  1. Left ventricular ejection fraction, by estimation, is 35 to 40%. The left ventricle has moderately decreased function. The left ventricle demonstrates global hypokinesis. The left ventricular internal cavity size was mildly dilated. There is mild concentric left ventricular hypertrophy. Left ventricular diastolic parameters are consistent with Grade I diastolic dysfunction (impaired relaxation). The average left ventricular global longitudinal strain is -6.7 %. The global longitudinal strain is abnormal.  2. Right ventricular systolic function is normal. The right ventricular size is normal. Tricuspid regurgitation signal is inadequate for assessing PA pressure.  3. Left atrial size was moderately dilated.  4. The mitral valve is normal in structure. Mild to moderate mitral valve regurgitation. No evidence of mitral stenosis.  5. The aortic valve is normal in structure. Aortic valve regurgitation is not visualized. Aortic valve sclerosis is present, with no evidence of aortic valve stenosis.  6. The inferior vena cava is normal in size with greater than 50% respiratory variability, suggesting right atrial pressure of 3 mmHg. FINDINGS  Left Ventricle: Left ventricular ejection fraction, by estimation, is 35 to 40%. The left ventricle has moderately decreased function. The left ventricle demonstrates global hypokinesis. The average left ventricular global longitudinal strain is -6.7 %.  Strain was performed and the global longitudinal strain is abnormal. Global longitudinal strain performed but not reported based  on interpreter judgement due to suboptimal tracking. The left ventricular internal cavity size was mildly dilated. There is mild concentric left ventricular hypertrophy. Left ventricular diastolic parameters are consistent with Grade I diastolic dysfunction (impaired  relaxation). Right Ventricle: The right ventricular size is normal. No increase in right ventricular wall thickness. Right ventricular systolic function is normal. Tricuspid regurgitation signal is inadequate for assessing PA pressure. Left Atrium: Left atrial size was moderately dilated. Right Atrium: Right atrial size was normal in size. Pericardium: There is no evidence of pericardial effusion. Mitral Valve: The mitral valve is normal in structure. Mild to moderate mitral valve regurgitation. No evidence of mitral valve stenosis. Tricuspid Valve: The tricuspid valve is normal in structure. Tricuspid valve regurgitation is not demonstrated. No evidence of tricuspid stenosis. Aortic Valve: The aortic valve is normal in structure. Aortic valve regurgitation is not visualized. Aortic valve sclerosis is present, with no evidence of aortic valve stenosis. Aortic valve mean gradient measures 2.0 mmHg. Aortic valve peak gradient measures 3.7 mmHg. Aortic valve area, by VTI measures 2.43 cm. Pulmonic Valve: The pulmonic valve was normal in structure. Pulmonic valve regurgitation is not visualized. No evidence of pulmonic stenosis. Aorta: The aortic root is normal in size and structure. Venous: The inferior vena cava is normal in size with greater than 50% respiratory variability, suggesting right atrial pressure of 3 mmHg. IAS/Shunts: No atrial level shunt detected by color flow Doppler.  LEFT VENTRICLE PLAX 2D LVIDd:         5.89 cm   Diastology LVIDs:         4.82 cm   LV e' medial:    5.77 cm/s LV PW:         1.07 cm   LV E/e' medial:  15.9 LV IVS:        1.03 cm   LV e' lateral:   11.00 cm/s LVOT diam:     2.07 cm   LV E/e' lateral: 8.3 LV SV:         44  LV SV Index:   18        2D Longitudinal Strain LVOT Area:     3.37 cm  2D Strain GLS Avg:     -6.7 %                           3D Volume EF:                          3D EF:        43 %                          LV EDV:       353 ml                          LV ESV:       200 ml                          LV SV:        153 ml RIGHT VENTRICLE RV S prime:     12.00 cm/s TAPSE (M-mode): 2.0 cm LEFT ATRIUM              Index        RIGHT ATRIUM           Index LA diam:        4.55 cm  1.88 cm/m   RA Area:     20.50 cm LA Vol (A2C):   109.0  ml 45.15 ml/m  RA Volume:   65.60 ml  27.18 ml/m LA Vol (A4C):   86.5 ml  35.83 ml/m LA Biplane Vol: 99.2 ml  41.09 ml/m  AORTIC VALVE AV Area (Vmax):    2.64 cm AV Area (Vmean):   2.63 cm AV Area (VTI):     2.43 cm AV Vmax:           95.70 cm/s AV Vmean:          66.200 cm/s AV VTI:            0.183 m AV Peak Grad:      3.7 mmHg AV Mean Grad:      2.0 mmHg LVOT Vmax:         75.00 cm/s LVOT Vmean:        51.700 cm/s LVOT VTI:          0.132 m LVOT/AV VTI ratio: 0.72  AORTA Ao Root diam: 3.38 cm MITRAL VALVE MV Area (PHT): 5.84 cm    SHUNTS MV Decel Time: 130 msec    Systemic VTI:  0.13 m MV E velocity: 91.60 cm/s  Systemic Diam: 2.07 cm MV A velocity: 20.40 cm/s MV E/A ratio:  4.49 Kardie Tobb DO Electronically signed by Dub Huntsman DO Signature Date/Time: 12/25/2023/6:14:51 PM    Final    DG Chest 2 View Result Date: 12/24/2023 EXAM: 2 VIEW(S) XRAY OF THE CHEST 12/24/2023 11:40:00 AM COMPARISON: 2 view chest x-ray 09/12/2023. CLINICAL HISTORY: SOB. FINDINGS: LUNGS AND PLEURA: Bibasilar airspace disease likely reflects atelectasis. Infection is not excluded. New small bilateral pleural effusions are present. HEART AND MEDIASTINUM: The heart is enlarged. BONES AND SOFT TISSUES: No acute osseous abnormality. IMPRESSION: 1. New small bilateral pleural effusions. 2. Bibasilar airspace disease, likely atelectasis. Infection not excluded. 3. Enlarged heart. Electronically signed  by: Lonni Necessary MD 12/24/2023 11:45 AM EDT RP Workstation: [id number]      Subjective: Patient seen and examined.  Feels okay to go home today.  Denies any chest pain, worsening shortness of breath, vomiting.  Discharge Exam: Vitals:   12/28/23 0525 12/28/23 0839  BP: 117/60 135/76  Pulse: (!) 57 61  Resp: 15 18  Temp: 98 F (36.7 C) 97.8 F (36.6 C)  SpO2: 100% 99%    General: Pt is alert, awake, not in acute distress.  On room air. Cardiovascular: Intermittent bradycardia present, S1/S2 + Respiratory: bilateral decreased breath sounds at bases with some scattered crackles Abdominal: Soft, NT, ND, bowel sounds + Extremities: Trace lower extremity edema present; no cyanosis    The results of significant diagnostics from this hospitalization (including imaging, microbiology, ancillary and laboratory) are listed below for reference.     Microbiology: Recent Results (from the past 240 hours)  MRSA Next Gen by PCR, Nasal     Status: None   Collection Time: 12/27/23 10:05 AM   Specimen: Nasal Mucosa; Nasal Swab  Result Value Ref Range Status   MRSA by PCR Next Gen NOT DETECTED NOT DETECTED Final    Comment: (NOTE) The GeneXpert MRSA Assay (FDA approved for NASAL specimens only), is one component of a comprehensive MRSA colonization surveillance program. It is not intended to diagnose MRSA infection nor to guide or monitor treatment for MRSA infections. Test performance is not FDA approved in patients less than 81 years old. Performed at [facility], [address], [place], [place] [address]      Labs: BNP (last 3 results) No results for input(s): BNP  in the last 8760 hours. Basic Metabolic Panel: Recent Labs  Lab 12/24/23 1128 12/25/23 0437 12/25/23 1207 12/26/23 0521 12/27/23 0826 12/27/23 1440 12/28/23 0654  NA 142 143  --  139 137 140 140  K 4.4 4.0  --  3.7 4.0 3.7 4.1  CL 107 104  --  101 101  --  102  CO2 25 28  --  28 28  --  26   GLUCOSE 132* 102*  --  149* 124*  --  141*  BUN 18 19  --  22 23  --  25*  CREATININE 1.27* 1.42*  --  1.45* 1.45*  --  1.58*  CALCIUM  9.6 10.1  --  9.6 9.2  --  9.1  MG  --  1.5* 1.2*  --  1.7  --   --   PHOS  --   --   --   --  4.0  --   --    Liver Function Tests: Recent Labs  Lab 12/26/23 0521 12/27/23 0826  AST 28 20  ALT 45* 37  ALKPHOS 45 42  BILITOT 0.6 0.6  PROT 6.0* 6.1*  ALBUMIN 3.2* 3.2*   No results for input(s): LIPASE, AMYLASE in the last 168 hours. No results for input(s): AMMONIA in the last 168 hours. CBC: Recent Labs  Lab 12/24/23 1128 12/25/23 0437 12/26/23 0521 12/27/23 0826 12/27/23 1440 12/28/23 0654  WBC 4.1 4.9 4.7 3.7*  --  5.1  NEUTROABS 2.4  --   --   --   --   --   HGB 12.5* 12.6* 12.3* 12.3* 12.6* 12.8*  HCT 40.1 38.5* 36.5* 37.4* 37.0* 38.5*  MCV 86.1 84.2 80.8 82.0  --  82.3  PLT 249 281 255 274  --  271   Cardiac Enzymes: No results for input(s): CKTOTAL, CKMB, CKMBINDEX, TROPONINI in the last 168 hours. BNP: Invalid input(s): POCBNP CBG: Recent Labs  Lab 12/27/23 0624 12/27/23 1109 12/27/23 1700 12/27/23 2140 12/28/23 0537  GLUCAP 110* 118* 96 161* 145*   D-Dimer No results for input(s): DDIMER in the last 72 hours. Hgb A1c No results for input(s): HGBA1C in the last 72 hours. Lipid Profile Recent Labs    12/26/23 0521  CHOL 122  HDL 47  LDLCALC 60  TRIG 73  CHOLHDL 2.6   Thyroid function studies Recent Labs    12/27/23 0826  TSH 1.637   Anemia work up No results for input(s): VITAMINB12, FOLATE, FERRITIN, TIBC, IRON, RETICCTPCT in the last 72 hours. Urinalysis    Component Value Date/Time   COLORURINE YELLOW 08/26/2022 1504   APPEARANCEUR CLEAR 08/26/2022 1504   LABSPEC 1.012 08/26/2022 1504   PHURINE 5.0 08/26/2022 1504   GLUCOSEU NEGATIVE 08/26/2022 1504   HGBUR NEGATIVE 08/26/2022 1504   BILIRUBINUR NEGATIVE 08/26/2022 1504   KETONESUR NEGATIVE 08/26/2022 1504    PROTEINUR NEGATIVE 08/26/2022 1504   NITRITE NEGATIVE 08/26/2022 1504   LEUKOCYTESUR NEGATIVE 08/26/2022 1504   Sepsis Labs Recent Labs  Lab 12/25/23 0437 12/26/23 0521 12/27/23 0826 12/28/23 0654  WBC 4.9 4.7 3.7* 5.1   Microbiology Recent Results (from the past 240 hours)  MRSA Next Gen by PCR, Nasal     Status: None   Collection Time: 12/27/23 10:05 AM   Specimen: Nasal Mucosa; Nasal Swab  Result Value Ref Range Status   MRSA by PCR Next Gen NOT DETECTED NOT DETECTED Final    Comment: (NOTE) The GeneXpert MRSA Assay (FDA approved for NASAL specimens only), is  one component of a comprehensive MRSA colonization surveillance program. It is not intended to diagnose MRSA infection nor to guide or monitor treatment for MRSA infections. Test performance is not FDA approved in patients less than 10 years old. Performed at [facility], [address], [place], [place] [address]      Time coordinating discharge: 35 minutes  SIGNED:   Sophie Mao, MD  [facility] 12/28/2023, 9:40 AM

## 2023-12-28 NOTE — Telephone Encounter (Signed)
 Patient Product/process development scientist completed.    The patient is insured through [facility].     Ran test claim for Eliquis  5 mg and the current 30 day co-pay is $4.00.  Ran test claim for Jardiance  10 mg and the current 30 day co-pay is $4.00.  Ran test claim for Entresto  24-26 mg and the current 30 day co-pay is $4.00.  This test claim was processed through [facility]- copay amounts may vary at other pharmacies due to pharmacy/plan contracts, or as the patient moves through the different stages of their insurance plan.     Reyes Sharps, CPHT Pharmacy Technician III Certified Patient Advocate [facility] Patient Advocate Team Direct Number: [phone]  Fax: [phone]

## 2023-12-28 NOTE — Telephone Encounter (Signed)
 Call placed to pt and he has been made aware to hold the Digoxin  on the morning of 01/06/24 and that he can bring it with him and take it once his labs have been drawn.

## 2024-01-04 ENCOUNTER — Encounter: Payer: Self-pay | Admitting: *Deleted

## 2024-01-06 ENCOUNTER — Encounter: Payer: Self-pay | Admitting: Emergency Medicine

## 2024-01-06 ENCOUNTER — Ambulatory Visit: Attending: Emergency Medicine | Admitting: Emergency Medicine

## 2024-01-06 VITALS — BP 100/64 | HR 67 | Ht 79.0 in | Wt 217.0 lb

## 2024-01-06 DIAGNOSIS — I2510 Atherosclerotic heart disease of native coronary artery without angina pectoris: Secondary | ICD-10-CM | POA: Insufficient documentation

## 2024-01-06 DIAGNOSIS — I4819 Other persistent atrial fibrillation: Secondary | ICD-10-CM | POA: Insufficient documentation

## 2024-01-06 DIAGNOSIS — I5020 Unspecified systolic (congestive) heart failure: Secondary | ICD-10-CM | POA: Diagnosis present

## 2024-01-06 DIAGNOSIS — E785 Hyperlipidemia, unspecified: Secondary | ICD-10-CM | POA: Insufficient documentation

## 2024-01-06 DIAGNOSIS — E119 Type 2 diabetes mellitus without complications: Secondary | ICD-10-CM | POA: Insufficient documentation

## 2024-01-06 DIAGNOSIS — G4733 Obstructive sleep apnea (adult) (pediatric): Secondary | ICD-10-CM | POA: Diagnosis present

## 2024-01-06 DIAGNOSIS — I10 Essential (primary) hypertension: Secondary | ICD-10-CM | POA: Diagnosis present

## 2024-01-06 DIAGNOSIS — I483 Typical atrial flutter: Secondary | ICD-10-CM | POA: Diagnosis present

## 2024-01-06 DIAGNOSIS — N1831 Chronic kidney disease, stage 3a: Secondary | ICD-10-CM | POA: Insufficient documentation

## 2024-01-06 NOTE — Progress Notes (Signed)
 Cardiology Office Note:    Date:  01/06/2024  ID:  Gary Lawrence, DOB 1960-12-06, MRN [id number] PCP: [name], FNP  [facility] Providers Cardiologist:  Darryle ONEIDA Decent, MD Cardiology APP:  Rana Lum CROME, NP       Patient Profile:       Chief Complaint: Hospital follow-up History of Present Illness:  Gary Lawrence is a 63 y.o. male with visit-pertinent history of atrial fibrillation/flutter, obstructive sleep apnea  He was established at [facility].  He has history of atrial fibrillation/flutter. He is status post ablation in 2018.  He has been on Xarelto .  He has history of sleep apnea and uses CPAP.  Other cardiac workup includes nuclear stress test in 2021 with a small mid severity defect in the apical and apical septal segments.  It was thought to be artifact.  EF was 53%.  He presented to the emergency room on 8/30 with dizziness and palpitations and intermittent chest tightness.  In the ED he had wide-complex tachycardia that broke and likely represented apparent right bundle branch block morphology conduction with 2:1 AV block and atrial flutter.  His proBNP was 3163.  Troponin was minimally elevated and flat at 22 and 23.  Chest x-ray demonstrated small right basilar effusion.  He was started on amiodarone  for rhythm control.  Echocardiogram on 8/31 showed LVEF 35 to 40%, LV has mildly decreased function, global hypokinesis, internal cavity size mildly dilated, mild LVH, grade 1 DD, RV normal, left atrial size mildly dilated, mild to moderate mitral valve regurgitation.  He was treated with IV diuretics and diuresed well.  Spironolactone  12.5 mg daily was added and he was transition to metoprolol  succinate 25 mg daily.  He underwent cardiac catheterization on 9/2 showing mild nonobstructive CAD.  He was started on digoxin  0.125 mg daily and Entresto  24-26 mg twice daily.  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  He was discharged home in stable  condition.   Discussed the use of AI scribe software for clinical note transcription with the patient, who gave verbal consent to proceed.  History of Present Illness Gary Lawrence is a 63 year old male with atrial fibrillation and heart failure who presents for follow-up after a recent hospitalization.  Today patient is doing well overall.  He is without any acute cardiovascular concerns or complaints.  Tells me he feels much improved, best he has felt in several years.  Reports he has had complete resolution of his lower extremity swelling.  Reports his shortness of breath is resolved.  He denies any chest pains or exertional symptoms.  He has steadily lost weight and denies any weight gain.  No orthopnea, PND, syncope or presyncope.  He denies any palpitations or symptoms concerning for recurrent atrial fibrillation.  He inquires if he can start his metformin back as he is worried about his diabetes  His social history includes a long incarceration from age 21 to 4.  He has been out of prison for nearly five years, working two jobs and serving as a Education officer, environmental.    Review of systems:  Please see the history of present illness. All other systems are reviewed and otherwise negative.      Studies Reviewed:    EKG Interpretation Date/Time:  Friday January 06 2024 10:05:27 EDT Ventricular Rate:  67 PR Interval:  166 QRS Duration:  94 QT Interval:  366 QTC Calculation: 386 R Axis:   43  Text Interpretation: Normal sinus rhythm Nonspecific T wave abnormality When  compared with ECG of 25-Dec-2023 09:31, PREVIOUS ECG IS PRESENT Confirmed by Rana Dixon [id number]) on 01/06/2024 12:35:07 PM    Echocardiogram 12/25/2023 1. Left ventricular ejection fraction, by estimation, is 35 to 40%. The  left ventricle has moderately decreased function. The left ventricle  demonstrates global hypokinesis. The left ventricular internal cavity size  was mildly dilated. There is mild  concentric left  ventricular hypertrophy. Left ventricular diastolic  parameters are consistent with Grade I diastolic dysfunction (impaired  relaxation). The average left ventricular global longitudinal strain is  -6.7 %. The global longitudinal strain is  abnormal.   2. Right ventricular systolic function is normal. The right ventricular  size is normal. Tricuspid regurgitation signal is inadequate for assessing  PA pressure.   3. Left atrial size was moderately dilated.   4. The mitral valve is normal in structure. Mild to moderate mitral valve  regurgitation. No evidence of mitral stenosis.   5. The aortic valve is normal in structure. Aortic valve regurgitation is  not visualized. Aortic valve sclerosis is present, with no evidence of  aortic valve stenosis.   6. The inferior vena cava is normal in size with greater than 50%  respiratory variability, suggesting right atrial pressure of 3 mmHg.   Cardiac catheterization 12/27/2023   Mid RCA lesion is 30% stenosed.   Ramus lesion is 20% stenosed.   Prox Cx lesion is 30% stenosed.   Dist Cx lesion is 30% stenosed.   Mid LAD to Dist LAD lesion is 20% stenosed.   Mild non-obstructive CAD Mild elevation right and left heart pressures (PCWP 19 mmHg, PA mean 25 mmHg, LVEDP 21 mmHg).    Medical management of CAD. Continue diuresis.  Diagnostic Dominance: Right  Risk Assessment/Calculations:    CHA2DS2-VASc Score = 5   This indicates a 7.2% annual risk of stroke. The patient's score is based upon: CHF History: 1 HTN History: 1 Diabetes History: 1 Stroke History: 0 Vascular Disease History: 1 Age Score: 1 Gender Score: 0             Physical Exam:   VS:  BP 100/64 (BP Location: Right Arm, Patient Position: Sitting, Cuff Size: Normal)   Pulse 67   Ht 6' 7 (2.007 m)   Wt 217 lb (98.4 kg)   BMI 24.45 kg/m    Wt Readings from Last 3 Encounters:  01/06/24 217 lb (98.4 kg)  12/28/23 216 lb 4.3 oz (98.1 kg)  12/24/23 229 lb 15 oz (104.3  kg)    GEN: Well nourished, well developed in no acute distress NECK: No JVD; No carotid bruits CARDIAC: RRR, no murmurs, rubs, gallops RESPIRATORY:  Clear to auscultation without rales, wheezing or rhonchi  ABDOMEN: Soft, non-tender, non-distended EXTREMITIES:  No edema; No acute deformity      Assessment and Plan:  Systolic heart failure Echocardiogram 11/2023 with LVEF 35 to 40%, global hypokinesis, mild LVH, grade 1 DD, RV normal LHC 11/2023 with nonobstructive disease - 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  Will need to be considered for repeat A-fib ablation.  Referral placed to EP today.  Will continue amiodarone  and digoxin  at current dose.  He is maintaining NSR today - Today patient appears euvolemic and well compensated on exam.  Reports dyspnea and leg swelling have resolved.  No orthopnea, PND, chest pains. - Continue digoxin  0.125 mg daily, metoprolol  succinate 25 mg daily, spironolactone  12.5 mg daily, Jardiance  10 mg daily, Entresto  24-26 mg twice daily - Plan to repeat echocardiogram x 3  months to reassess LV function - Digoxin  level, BMET, CBC today - Encouraged fluid/sodium restriction and daily weights  Atrial fibrillation/flutter He has a history of AF and atrial flutter, with rapid ventricular response. He underwent cryoballoon pulmonary vein isolation and radiofrequency ablation of the cavotricuspid isthmus in September, 2018 with [facility] He had recurrence of atrial flutter during admission on 12/2023, now managed on amiodarone  and digoxin  for rhythm control in the setting of low EF - Today he is maintaining sinus rhythm.  He denies any symptoms concerning for recurrent fibrillation/flutter - Continue amiodarone  200 mg daily - Continue digoxin  0.125 mg daily - Continue metoprolol  succinate 25 mg daily - Continue Eliquis  5 mg twice daily, no bleeding concerns - Refer to EP for consideration of ablation - Digoxin  level, BMET, CBC today - Consider amiodarone   monitoring labs TSH/LFTs at follow-up visit  CKD stage IIIa Creatinine 1.58 and GFR 48 on 12/2023 - Stable, avoid nephrotoxic drugs - Repeat BMET today  Hypertension Blood pressure today is well-controlled at 100/64 - He denies any lightheadedness, dizziness, syncope or presyncope - Continue HF GDMT  Coronary artery disease Hyperlipidemia, LDL goal <70 Cardiac catheterization 12/2023 showed mild nonobstructive CAD LDL 60 on 12/2023 and well-controlled - Today he is without anginal symptoms.  No indication for further ischemic evaluation at this time - No ASA given OAC - Continue lovastatin 40 mg daily  T2DM Previous A1c was 6.5% Metformin discontinued during recent hospitalization in place of Jardiance  Okay to restart metformin from cardiology perspective, will defer to PCP - Currently managed on glipizide and Jardiance  - Management per PCP  Obstructive sleep apnea - Remains adherent to CPAP therapy      Dispo:  Return in about 3 months (around 04/06/2024).  Signed, Lum LITTIE Louis, NP

## 2024-01-06 NOTE — Patient Instructions (Signed)
 Medication Instructions:  PLEASE TAKE ALL CURRENT MEDICATIONS AS PRESCRIBED.  Lab Work: BMET, CBC, AND DIGOXIN  LEVEL TO BE DONE TODAY.  Testing/Procedures: TO BE DONE IN 3 MONTHS.  Your physician has requested that you have an ECHOCARDIOGRAM. Echocardiography is a painless test that uses sound waves to create images of your heart. It provides your doctor with information about the size and shape of your heart and how well your heart's chambers and valves are working. This procedure takes approximately one hour. There are no restrictions for this procedure. Please do NOT wear cologne, perfume, aftershave, or lotions (deodorant is allowed). Please arrive 15 minutes prior to your appointment time.  Please note: We ask at that you not bring children with you during ultrasound (echo/ vascular) testing. Due to room size and safety concerns, children are not allowed in the ultrasound rooms during exams. Our front office staff cannot provide observation of children in our lobby area while testing is being conducted. An adult accompanying a patient to their appointment will only be allowed in the ultrasound room at the discretion of the ultrasound technician under special circumstances. We apologize for any inconvenience.   Follow-Up: At [facility], you and your health needs are our priority.  As part of our continuing mission to provide you with exceptional heart care, our providers are all part of one team.  This team includes your primary Cardiologist (physician) and Advanced Practice Providers or APPs (Physician Assistants and Nurse Practitioners) who all work together to provide you with the care you need, when you need it.  Your next appointment:   POST ECHOCARDIOGRAM  Provider:   DR. BARBARAANN, MD

## 2024-01-07 LAB — CBC
Hematocrit: 44.3 % (ref 37.5–51.0)
Hemoglobin: 14.3 g/dL (ref 13.0–17.7)
MCH: 27.5 pg (ref 26.6–33.0)
MCHC: 32.3 g/dL (ref 31.5–35.7)
MCV: 85 fL (ref 79–97)
Platelets: 325 x10E3/uL (ref 150–450)
RBC: 5.2 x10E6/uL (ref 4.14–5.80)
RDW: 12.6 % (ref 11.6–15.4)
WBC: 6.1 x10E3/uL (ref 3.4–10.8)

## 2024-01-07 LAB — BASIC METABOLIC PANEL WITH GFR
BUN/Creatinine Ratio: 21 (ref 10–24)
BUN: 30 mg/dL — ABNORMAL HIGH (ref 8–27)
CO2: 24 mmol/L (ref 20–29)
Calcium: 10.5 mg/dL — ABNORMAL HIGH (ref 8.6–10.2)
Chloride: 101 mmol/L (ref 96–106)
Creatinine, Ser: 1.46 mg/dL — ABNORMAL HIGH (ref 0.76–1.27)
Glucose: 125 mg/dL — ABNORMAL HIGH (ref 70–99)
Potassium: 4.6 mmol/L (ref 3.5–5.2)
Sodium: 143 mmol/L (ref 134–144)
eGFR: 53 mL/min/1.73 — ABNORMAL LOW (ref >59)

## 2024-01-07 LAB — DIGOXIN LEVEL: Digoxin, Serum: 0.8 ng/mL (ref 0.5–0.9)

## 2024-01-09 ENCOUNTER — Ambulatory Visit: Payer: Self-pay | Admitting: Emergency Medicine

## 2024-01-09 DIAGNOSIS — Z79899 Other long term (current) drug therapy: Secondary | ICD-10-CM

## 2024-01-30 ENCOUNTER — Other Ambulatory Visit (HOSPITAL_COMMUNITY): Payer: Self-pay

## 2024-01-31 ENCOUNTER — Encounter: Payer: Self-pay | Admitting: Podiatry

## 2024-01-31 ENCOUNTER — Encounter: Payer: Self-pay | Admitting: Cardiovascular Disease

## 2024-01-31 ENCOUNTER — Other Ambulatory Visit: Payer: Self-pay | Admitting: Cardiovascular Disease

## 2024-01-31 ENCOUNTER — Ambulatory Visit: Admitting: Podiatry

## 2024-01-31 VITALS — Ht 79.0 in | Wt 217.0 lb

## 2024-01-31 DIAGNOSIS — M79675 Pain in left toe(s): Secondary | ICD-10-CM | POA: Diagnosis not present

## 2024-01-31 DIAGNOSIS — B351 Tinea unguium: Secondary | ICD-10-CM

## 2024-01-31 DIAGNOSIS — I4891 Unspecified atrial fibrillation: Secondary | ICD-10-CM

## 2024-01-31 DIAGNOSIS — M79674 Pain in right toe(s): Secondary | ICD-10-CM | POA: Diagnosis not present

## 2024-01-31 LAB — BASIC METABOLIC PANEL WITH GFR
BUN/Creatinine Ratio: 9 — ABNORMAL LOW (ref 10–24)
BUN: 15 mg/dL (ref 8–27)
CO2: 22 mmol/L (ref 20–29)
Calcium: 9.5 mg/dL (ref 8.6–10.2)
Chloride: 103 mmol/L (ref 96–106)
Creatinine, Ser: 1.61 mg/dL — ABNORMAL HIGH (ref 0.76–1.27)
Glucose: 83 mg/dL (ref 70–99)
Potassium: 4.5 mmol/L (ref 3.5–5.2)
Sodium: 141 mmol/L (ref 134–144)
eGFR: 47 mL/min/1.73 — ABNORMAL LOW (ref >59)

## 2024-01-31 MED ORDER — APIXABAN 5 MG PO TABS: 5.0000 mg | ORAL_TABLET | Freq: Two times a day (BID) | ORAL | 2 refills | Status: DC

## 2024-01-31 MED ORDER — SACUBITRIL-VALSARTAN 24-26 MG PO TABS: 1.0000 | ORAL_TABLET | Freq: Two times a day (BID) | ORAL | 3 refills | Status: DC

## 2024-01-31 MED ORDER — SPIRONOLACTONE 25 MG PO TABS: 12.5000 mg | ORAL_TABLET | Freq: Every day | ORAL | 3 refills | Status: DC

## 2024-01-31 MED ORDER — METOPROLOL SUCCINATE ER 25 MG PO TB24
25.0000 mg | ORAL_TABLET | Freq: Every day | ORAL | 3 refills | Status: AC
Start: 2024-01-31 — End: ?

## 2024-01-31 MED ORDER — EMPAGLIFLOZIN 10 MG PO TABS: 10.0000 mg | ORAL_TABLET | Freq: Every day | ORAL | 3 refills | Status: DC

## 2024-01-31 MED ORDER — DIGOXIN 125 MCG PO TABS: 0.1250 mg | ORAL_TABLET | Freq: Every day | ORAL | 3 refills | Status: DC

## 2024-01-31 MED ORDER — AMIODARONE HCL 200 MG PO TABS: ORAL_TABLET | ORAL | 3 refills | Status: DC

## 2024-01-31 NOTE — Telephone Encounter (Signed)
 Eliquis  5mg  refill request received. Patient is 63 years old, weight-98.4kg, Crea-1.46 on 01/06/24, Diagnosis-Afib, and last seen by [facility] on 01/06/24. Dose is appropriate based on dosing criteria. Will send in refill to requested pharmacy.

## 2024-01-31 NOTE — Telephone Encounter (Signed)
 RX's sent in. Eliquis  routed to [facility]

## 2024-01-31 NOTE — Progress Notes (Signed)
  Subjective:  Patient ID: Gary Lawrence, male    DOB: 1960/05/08,  MRN: [id number]  Chief Complaint  Patient presents with   Diabetes    RM 2  DFC    63 y.o. male returns with the above complaint. History confirmed with patient.  He has thickened discolored and uncomfortable toenails.  Debridement has been helpful in reducing pain and improving function  Objective:  Physical Exam: warm, good capillary refill, no trophic changes or ulcerative lesions and normal DP and PT pulses.  Decreased protective sensation at tips of toes.  Onychomycosis bilaterally 1, 2, 5 and 3 on the right side.   Pes planus deformity. Assessment:   1. Pain due to onychomycosis of toenails of both feet         Plan:  Patient was evaluated and treated and all questions answered.  Discussed the etiology and treatment options for the condition in detail with the patient.  Recommended debridement of the nails today. Sharp and mechanical debridement performed of all painful and mycotic nails today. Nails debrided in length and thickness using a nail nipper and a mechanical burr to level of comfort. Discussed treatment options including appropriate shoe gear. Follow up as needed for painful nails.     Return in about 3 months (around 05/02/2024) for at risk diabetic foot care.

## 2024-01-31 NOTE — Telephone Encounter (Signed)
*  STAT* If patient is at the pharmacy, call can be transferred to refill team.   1. Which medications need to be refilled? (please list name of each medication and dose if known) Amiodarone  , eliquis , digoxin , empagliflozin , metoprolo succinate, sacubitril  valsartan , and spironolactone    2. Would you like to learn more about the convenience, safety, & potential cost savings by using the [facility]? NA    3. Are you open to using the [facility] NA   4. Which pharmacy/location (including street and city if local pharmacy) is medication to be sent to?[facility] [address]   5. Do they need a 30 day or 90 day supply? 90 days for all(completely out )

## 2024-02-01 ENCOUNTER — Telehealth: Payer: Self-pay | Admitting: Cardiovascular Disease

## 2024-02-01 NOTE — Telephone Encounter (Signed)
 Pt c/o medication issue:  1. Name of Medication:   amiodarone  (PACERONE ) 200 MG tablet    2. How are you currently taking this medication (dosage and times per day)? a  3. Are you having a reaction (difficulty breathing--STAT)? no  4. What is your medication issue? Calling for clarity on the direction for medication. Please advise

## 2024-02-03 ENCOUNTER — Encounter: Payer: Self-pay | Admitting: *Deleted

## 2024-02-03 MED ORDER — AMIODARONE HCL 200 MG PO TABS: 200.0000 mg | ORAL_TABLET | Freq: Every day | ORAL | 3 refills | Status: DC

## 2024-02-03 NOTE — Telephone Encounter (Signed)
 Pharmacy calling to f/u on call from yesterday regarding medication instructions for Amiodarone  200 MG. Please advise

## 2024-02-03 NOTE — Telephone Encounter (Signed)
 Spoke to pharmacist.  Myles pt should be taking Amiodarone  200 mg ONCE a day.  They will update Rx there.  The will update to fill for 90 day  (90 tablets, not 180 that was sent in) with refills.  Will update MAR.  Pharmacy does NOT need us  to send in updated Rx as they will correct on their end.

## 2024-02-03 NOTE — Addendum Note (Signed)
 Addended by: BILLY CAMELIA CROME on: 02/03/2024 08:29 AM   Modules accepted: Orders

## 2024-03-08 NOTE — Progress Notes (Deleted)
 Electrophysiology Office Note:   Date:  03/08/2024  ID:  Gary Lawrence, DOB April 21, 1961, MRN [id number]  Primary Cardiologist: Gary ONEIDA Decent, MD Electrophysiologist: Gary Kitty, MD  {Click to update primary MD,subspecialty MD or APP then REFRESH:1}    History of Present Illness:   Gary Lawrence is a 63 y.o. male with h/o atrial fibrillation and atrial flutter status post catheter ablation at [facility] in 2018, chronic systolic heart failure who is being seen today for evaluation for catheter ablation at the request of Lum Louis, NP.  Discussed the use of AI scribe software for clinical note transcription with the patient, who gave verbal consent to proceed.  History of Present Illness     Review of systems complete and found to be negative unless listed in HPI.   EP Information / Studies Reviewed:    {EKGtoday:28818}     EKG 09/12/23: AF   ECG 12/24/23: Typical AFL    ECG 12/25/23: Atypical AFL   Echo 12/25/23:  1. Left ventricular ejection fraction, by estimation, is 35 to 40%. The  left ventricle has moderately decreased function. The left ventricle  demonstrates global hypokinesis. The left ventricular internal cavity size  was mildly dilated. There is mild  concentric left ventricular hypertrophy. Left ventricular diastolic  parameters are consistent with Grade I diastolic dysfunction (impaired  relaxation). The average left ventricular global longitudinal strain is  -6.7 %. The global longitudinal strain is  abnormal.   2. Right ventricular systolic function is normal. The right ventricular  size is normal. Tricuspid regurgitation signal is inadequate for assessing  PA pressure.   3. Left atrial size was moderately dilated.   4. The mitral valve is normal in structure. Mild to moderate mitral valve  regurgitation. No evidence of mitral stenosis.   5. The aortic valve is normal in structure. Aortic valve regurgitation is  not visualized. Aortic valve sclerosis  is present, with no evidence of  aortic valve stenosis.   6. The inferior vena cava is normal in size with greater than 50%  respiratory variability, suggesting right atrial pressure of 3 mmHg.   LHC/RHC 12/27/23:   Mid RCA lesion is 30% stenosed.   Ramus lesion is 20% stenosed.   Prox Cx lesion is 30% stenosed.   Dist Cx lesion is 30% stenosed.   Mid LAD to Dist LAD lesion is 20% stenosed.   Mild non-obstructive CAD Mild elevation right and left heart pressures (PCWP 19 mmHg, PA mean 25 mmHg, LVEDP 21 mmHg).    Medical management of CAD. Continue diuresis.    Physical Exam:   VS:  There were no vitals taken for this visit.   Wt Readings from Last 3 Encounters:  01/31/24 217 lb (98.4 kg)  01/06/24 217 lb (98.4 kg)  12/28/23 216 lb 4.3 oz (98.1 kg)     GEN: Well nourished, well developed in no acute distress NECK: No JVD CARDIAC: {EPRHYTHM:28826}, no murmurs, rubs, gallops RESPIRATORY:  Clear to auscultation without rales, wheezing or rhonchi  ABDOMEN: Soft, non-distended EXTREMITIES:  No edema; No deformity   ASSESSMENT AND PLAN:    #Persistent atrial fibrillation: Underwent cryoballoon pulmonary vein isolation and radiofrequency ablation of the cavotricuspid isthmus in September, 2018 with [facility]. He had recurrence of atrial flutter during admission on 12/2023, now managed on amiodarone  and digoxin  for rhythm. control in the setting of low EF #Atrial flutter: I have seen both typical and atypical on ECGs.  #Chronic systolic heart failure: Suspected tachy/arrhythmia mediated. Assessment & Plan  Follow up with {EPMDS:28135::EP Team} {EPFOLLOW LE:71826}  Signed, Gary Kitty, MD

## 2024-03-09 ENCOUNTER — Ambulatory Visit: Attending: Cardiology | Admitting: Cardiology

## 2024-03-09 DIAGNOSIS — I484 Atypical atrial flutter: Secondary | ICD-10-CM

## 2024-03-09 DIAGNOSIS — I5022 Chronic systolic (congestive) heart failure: Secondary | ICD-10-CM

## 2024-03-09 DIAGNOSIS — I483 Typical atrial flutter: Secondary | ICD-10-CM

## 2024-03-09 DIAGNOSIS — I4819 Other persistent atrial fibrillation: Secondary | ICD-10-CM

## 2024-03-10 ENCOUNTER — Encounter: Payer: Self-pay | Admitting: *Deleted

## 2024-03-10 ENCOUNTER — Ambulatory Visit: Admission: EM | Admit: 2024-03-10 | Discharge: 2024-03-10 | Disposition: A | Discharge status: Home / Self Care

## 2024-03-10 DIAGNOSIS — L02811 Cutaneous abscess of head [any part, except face]: Secondary | ICD-10-CM

## 2024-03-10 LAB — GLUCOSE, POCT (MANUAL RESULT ENTRY): POC Glucose: 190 mg/dL — AB (ref 70–99)

## 2024-03-10 MED ORDER — CEFTRIAXONE SODIUM 1 G IJ SOLR
1.0000 g | Freq: Once | INTRAMUSCULAR | Status: AC
Start: 2024-03-10 — End: 2024-03-10
  Administered 2024-03-10: 1 g via INTRAMUSCULAR

## 2024-03-10 MED ORDER — SULFAMETHOXAZOLE-TRIMETHOPRIM 800-160 MG PO TABS: 1.0000 | ORAL_TABLET | Freq: Two times a day (BID) | ORAL | 0 refills | Status: DC

## 2024-03-10 NOTE — ED Triage Notes (Signed)
 Pt states he had an abscess on the back of his neck for a week. It has been draining. He is here due to pain, which he states isn't as bad but still concerned. He thinks it came from a injury when his wife was cutting his hair with clippers.

## 2024-03-10 NOTE — Discharge Instructions (Addendum)
-  Change dressing daily.   -If you develop a fever 100.5 or above you to go to the ER.   -Check blood sugar, if your blood sugar gets higher than 300 and you go to the ER. -.  Take the antibiotics in their entirety, you were given IM antibiotics in office but start oral antibiotics today as well. -Make an amount of follow-up with your primary care doctor within a week -You may take Tylenol  only for pain relief.

## 2024-03-10 NOTE — ED Provider Notes (Signed)
 [facility]    CSN: [id number] Arrival date & time: 03/10/24  1022      History   Chief Complaint Chief Complaint  Patient presents with   Abscess    HPI Gary Lawrence is a 63 y.o. male.   Pt presents today due to abscess of scalp for the past week. Pt has a hx of diabetes. Pt states that he has been feeling well but has experienced night sweats and subjective fever. Pt states that the abscess started to drain on its own, patient has been using depends pull up as bandage because he had nothing else to use as a dressing. Pt states that he has been changing daily. Pt states that he has had something similar in the past, patient states that he let this one go too long.   The history is provided by the patient.  Abscess   Past Medical History:  Diagnosis Date   A-fib [facility])    Status post ablation   Cancer [facility])    Tonsils   Thyroid disease     Patient Active Problem List   Diagnosis Date Noted   HFrEF (heart failure with reduced ejection fraction) (HCC) 12/26/2023   Atrial flutter with rapid ventricular response (HCC) 12/25/2023   Chest pain 12/24/2023   Urinary incontinence 03/31/2023   GERD (gastroesophageal reflux disease) 07/17/2020   Type 2 diabetes mellitus without complications (HCC) 07/17/2020   Status post colonoscopy 07/16/2020   Typical atrial flutter (HCC) 04/13/2017   Sinus bradycardia 02/23/2017   Atrial fibrillation (HCC) 11/17/2016   Essential (primary) hypertension 05/19/2016   Thyroid activity decreased 01/31/2014   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 Authorizing Provider  ACCU-CHEK GUIDE test strip USE 1 STRIP ONCE DAILY FOR 90 DAYS 05/30/20  Yes [provider]  amiodarone  (PACERONE ) 200 MG tablet Take 1 tablet (200 mg total) by mouth daily. 02/03/24  Yes O'Neal, Darryle Ned, MD  apixaban  (ELIQUIS ) 5 MG TABS tablet Take 1 tablet (5 mg total) by mouth 2 (two) times daily. 01/31/24  Yes O'Neal, Darryle Ned, MD  brimonidine  (ALPHAGAN ) 0.15 % ophthalmic solution Apply 1 drop to eye as directed. 09/29/23  Yes [provider]  Cholecalciferol (VITAMIN D3) 50 MCG (2000 UT) TABS Take 2,000 Units by mouth daily.   Yes [provider]  cycloSPORINE (RESTASIS) 0.05 % ophthalmic emulsion Place 1 drop into both eyes 2 (two) times daily.   Yes [provider]  digoxin  (LANOXIN ) 0.125 MG tablet Take 1 tablet (0.125 mg total) by mouth daily. 01/31/24  Yes O'Neal, Darryle Ned, MD  empagliflozin  (JARDIANCE ) 10 MG TABS tablet Take 1 tablet (10 mg total) by mouth daily. 01/31/24  Yes O'Neal, Darryle Ned, MD  fenofibrate (TRICOR) 48 MG tablet Take 48 mg by mouth daily.   Yes [provider]  ferrous sulfate 324 MG TBEC Take 324 mg by mouth daily.   Yes [provider]  fluticasone (FLONASE) 50 MCG/ACT nasal spray Place 1 spray into both  nostrils daily.   Yes [provider]  glipiZIDE (GLUCOTROL XL) 2.5 MG 24 hr tablet Take 2.5 mg by mouth daily with breakfast.   Yes [provider]  lidocaine  (LIDODERM ) 5 % Place 1 patch onto the skin daily. Remove & Discard patch within 12 hours or as directed by MD 12/29/23  Yes Cheryle Page, MD  loratadine (CLARITIN) 10 MG tablet Take 10 mg by mouth daily.   Yes [provider]  lovastatin (MEVACOR) 40 MG tablet Take 40 mg by mouth daily. 06/30/23  Yes [provider]  metoprolol  succinate (TOPROL -XL) 25 MG 24 hr tablet Take 1  tablet (25 mg total) by mouth daily. 01/31/24  Yes O'Neal, Darryle Ned, MD  pantoprazole  (PROTONIX ) 40 MG tablet Take 40 mg by mouth daily.   Yes [provider]  sacubitril -valsartan  (ENTRESTO ) 24-26 MG Take 1 tablet by mouth 2 (two) times daily. 01/31/24  Yes O'Neal, Darryle Ned, MD  spironolactone  (ALDACTONE ) 25 MG tablet Take 0.5 tablets (12.5 mg total) by mouth daily. 01/31/24  Yes O'Neal, Darryle Ned, MD  sulfamethoxazole-trimethoprim (BACTRIM DS) 800-160 MG tablet Take 1 tablet by mouth 2 (two) times daily for 10 days. 03/10/24 03/20/24 Yes Andra Corean BROCKS, PA-C  diphenhydrAMINE (BENADRYL) 25 mg capsule Take 25 mg by mouth every 6 (six) hours as needed. Patient not taking: Reported on 03/10/2024    [provider]  sildenafil (VIAGRA) 100 MG tablet Take 100 mg by mouth as needed. 09/29/23   [provider]    Family History Family History  Problem Relation Age of Onset   Lupus Mother     Social History Social History   Tobacco Use   Smoking status: Never   Smokeless tobacco: Never  Vaping Use   Vaping status: Never Used  Substance Use Topics   Alcohol use: Never   Drug use: Never     Allergies   Honey bee venom, Other, and Milk (cow)   Review of Systems Review of Systems   Physical Exam Triage Vital Signs ED Triage Vitals  Encounter Vitals Group     BP 03/10/24 1152 (!) 156/84     Girls Systolic BP Percentile --      Girls Diastolic BP Percentile --      Boys Systolic BP Percentile --      Boys Diastolic BP Percentile --      Pulse Rate 03/10/24 1152 61     Resp 03/10/24 1152 18     Temp 03/10/24 1152 98.3 F (36.8 C)     Temp Source 03/10/24 1152 Oral     SpO2 03/10/24 1152 97 %     Weight --      Height --      Head Circumference --      Peak Flow --      Pain Score 03/10/24 1207 6     Pain Loc --      Pain Education --      Exclude from Growth Chart --    No data found.  Updated Vital Signs BP (!) 156/84 (BP  Location: Right Arm)   Pulse 61   Temp 98.3 F (36.8 C) (Oral)   Resp 18   SpO2 97%   Visual Acuity Right Eye Distance:   Left Eye Distance:   Bilateral Distance:    Right Eye Near:   Left Eye Near:    Bilateral Near:     Physical Exam Vitals and nursing note reviewed.  Constitutional:  General: He is not in acute distress.    Appearance: Normal appearance. He is not ill-appearing, toxic-appearing or diaphoretic.  HENT:     Head:      Comments: Large abscess with purulent drainage noted of back of scalp (see picture) Eyes:     General: No scleral icterus. Cardiovascular:     Rate and Rhythm: Normal rate and regular rhythm.     Heart sounds: Normal heart sounds.  Pulmonary:     Effort: Pulmonary effort is normal. No respiratory distress.     Breath sounds: Normal breath sounds. No wheezing or rhonchi.  Skin:    General: Skin is warm.  Neurological:     Mental Status: He is alert and oriented to person, place, and time.  Psychiatric:        Mood and Affect: Mood normal.        Behavior: Behavior normal.      UC Treatments / Results  Labs (all labs ordered are listed, but only abnormal results are displayed) Labs Reviewed  GLUCOSE, POCT (MANUAL RESULT ENTRY) - Abnormal; Notable for the following components:      Result Value   POC Glucose 190 (*)    All other components within normal limits  AEROBIC CULTURE W GRAM STAIN (SUPERFICIAL SPECIMEN)    EKG   Radiology No results found.  Procedures Procedures (including critical care time)  Medications Ordered in UC Medications  cefTRIAXone (ROCEPHIN) injection 1 g (has no administration in time range)    Initial Impression / Assessment and Plan / UC Course  I have reviewed the triage vital signs and the nursing notes.  Pertinent labs & imaging results that were available during my care of the patient were reviewed by me and considered in my medical decision making (see chart for details).    Final  Clinical Impressions(s) / UC Diagnoses   Final diagnoses:  Abscess of scalp     Discharge Instructions      -Change dressing daily.   -If you develop a fever 100.5 or above you to go to the ER.   -Check blood sugar, if your blood sugar gets higher than 300 and you go to the ER. -.  Take the antibiotics in their entirety, you were given IM antibiotics in office but start oral antibiotics today as well. -Make an amount of follow-up with your primary care doctor within a week -You may take Tylenol  only for pain relief.    ED Prescriptions     Medication Sig Dispense Auth. Provider   sulfamethoxazole-trimethoprim (BACTRIM DS) 800-160 MG tablet Take 1 tablet by mouth 2 (two) times daily for 10 days. 20 tablet Andra Corean BROCKS, PA-C      PDMP not reviewed this encounter.   Andra Corean BROCKS, PA-C 03/10/24 1227

## 2024-03-11 ENCOUNTER — Encounter (HOSPITAL_COMMUNITY): Payer: Self-pay

## 2024-03-11 ENCOUNTER — Emergency Department (HOSPITAL_COMMUNITY)

## 2024-03-11 ENCOUNTER — Inpatient Hospital Stay (HOSPITAL_COMMUNITY)
Admission: EM | Admit: 2024-03-11 | Discharge: 2024-03-14 | DRG: 603 | Disposition: A | Discharge status: Home / Self Care | Attending: Emergency Medicine | Admitting: Emergency Medicine

## 2024-03-11 ENCOUNTER — Other Ambulatory Visit: Payer: Self-pay

## 2024-03-11 DIAGNOSIS — L03811 Cellulitis of head [any part, except face]: Secondary | ICD-10-CM

## 2024-03-11 DIAGNOSIS — I2510 Atherosclerotic heart disease of native coronary artery without angina pectoris: Secondary | ICD-10-CM | POA: Diagnosis present

## 2024-03-11 DIAGNOSIS — Z85819 Personal history of malignant neoplasm of unspecified site of lip, oral cavity, and pharynx: Secondary | ICD-10-CM | POA: Diagnosis not present

## 2024-03-11 DIAGNOSIS — N1832 Chronic kidney disease, stage 3b: Secondary | ICD-10-CM | POA: Diagnosis present

## 2024-03-11 DIAGNOSIS — Z85818 Personal history of malignant neoplasm of other sites of lip, oral cavity, and pharynx: Secondary | ICD-10-CM

## 2024-03-11 DIAGNOSIS — Z9861 Coronary angioplasty status: Secondary | ICD-10-CM

## 2024-03-11 DIAGNOSIS — Z91030 Bee allergy status: Secondary | ICD-10-CM

## 2024-03-11 DIAGNOSIS — I10 Essential (primary) hypertension: Secondary | ICD-10-CM | POA: Diagnosis not present

## 2024-03-11 DIAGNOSIS — Z79899 Other long term (current) drug therapy: Secondary | ICD-10-CM | POA: Diagnosis not present

## 2024-03-11 DIAGNOSIS — I130 Hypertensive heart and chronic kidney disease with heart failure and stage 1 through stage 4 chronic kidney disease, or unspecified chronic kidney disease: Secondary | ICD-10-CM | POA: Diagnosis present

## 2024-03-11 DIAGNOSIS — Z7901 Long term (current) use of anticoagulants: Secondary | ICD-10-CM

## 2024-03-11 DIAGNOSIS — I4892 Unspecified atrial flutter: Secondary | ICD-10-CM | POA: Diagnosis present

## 2024-03-11 DIAGNOSIS — I5022 Chronic systolic (congestive) heart failure: Secondary | ICD-10-CM | POA: Diagnosis present

## 2024-03-11 DIAGNOSIS — M4802 Spinal stenosis, cervical region: Secondary | ICD-10-CM | POA: Diagnosis present

## 2024-03-11 DIAGNOSIS — Z955 Presence of coronary angioplasty implant and graft: Secondary | ICD-10-CM

## 2024-03-11 DIAGNOSIS — N183 Chronic kidney disease, stage 3 unspecified: Secondary | ICD-10-CM | POA: Diagnosis present

## 2024-03-11 DIAGNOSIS — Z9221 Personal history of antineoplastic chemotherapy: Secondary | ICD-10-CM | POA: Diagnosis not present

## 2024-03-11 DIAGNOSIS — I480 Paroxysmal atrial fibrillation: Secondary | ICD-10-CM | POA: Diagnosis present

## 2024-03-11 DIAGNOSIS — Z8269 Family history of other diseases of the musculoskeletal system and connective tissue: Secondary | ICD-10-CM | POA: Diagnosis not present

## 2024-03-11 DIAGNOSIS — B9562 Methicillin resistant Staphylococcus aureus infection as the cause of diseases classified elsewhere: Secondary | ICD-10-CM | POA: Diagnosis present

## 2024-03-11 DIAGNOSIS — L739 Follicular disorder, unspecified: Secondary | ICD-10-CM | POA: Diagnosis present

## 2024-03-11 DIAGNOSIS — L03221 Cellulitis of neck: Secondary | ICD-10-CM | POA: Diagnosis present

## 2024-03-11 DIAGNOSIS — E119 Type 2 diabetes mellitus without complications: Secondary | ICD-10-CM

## 2024-03-11 DIAGNOSIS — Z7984 Long term (current) use of oral hypoglycemic drugs: Secondary | ICD-10-CM | POA: Diagnosis not present

## 2024-03-11 DIAGNOSIS — Z91011 Allergy to milk products, unspecified: Secondary | ICD-10-CM | POA: Diagnosis not present

## 2024-03-11 DIAGNOSIS — E1122 Type 2 diabetes mellitus with diabetic chronic kidney disease: Secondary | ICD-10-CM | POA: Diagnosis present

## 2024-03-11 DIAGNOSIS — G4733 Obstructive sleep apnea (adult) (pediatric): Secondary | ICD-10-CM | POA: Diagnosis present

## 2024-03-11 DIAGNOSIS — I5020 Unspecified systolic (congestive) heart failure: Secondary | ICD-10-CM | POA: Diagnosis present

## 2024-03-11 LAB — CBC
HCT: 35.9 % — ABNORMAL LOW (ref 39.0–52.0)
Hemoglobin: 11.4 g/dL — ABNORMAL LOW (ref 13.0–17.0)
MCH: 26.6 pg (ref 26.0–34.0)
MCHC: 31.8 g/dL (ref 30.0–36.0)
MCV: 83.9 fL (ref 80.0–100.0)
Platelets: 445 K/uL — ABNORMAL HIGH (ref 150–400)
RBC: 4.28 MIL/uL (ref 4.22–5.81)
RDW: 14.2 % (ref 11.5–15.5)
WBC: 8.1 K/uL (ref 4.0–10.5)
nRBC: 0 % (ref 0.0–0.2)

## 2024-03-11 LAB — BASIC METABOLIC PANEL WITH GFR
Anion gap: 9 (ref 5–15)
BUN: 16 mg/dL (ref 8–23)
CO2: 29 mmol/L (ref 22–32)
Calcium: 9.2 mg/dL (ref 8.9–10.3)
Chloride: 104 mmol/L (ref 98–111)
Creatinine, Ser: 1.66 mg/dL — ABNORMAL HIGH (ref 0.61–1.24)
GFR, Estimated: 45 mL/min — ABNORMAL LOW (ref >60)
Glucose, Bld: 131 mg/dL — ABNORMAL HIGH (ref 70–99)
Potassium: 4.2 mmol/L (ref 3.5–5.1)
Sodium: 142 mmol/L (ref 135–145)

## 2024-03-11 MED ORDER — EMPAGLIFLOZIN 10 MG PO TABS
10.0000 mg | ORAL_TABLET | Freq: Every day | ORAL | Status: DC
  Administered 2024-03-11 – 2024-03-14 (×4): 10 mg via ORAL
  Filled 2024-03-11 (×5): qty 1

## 2024-03-11 MED ORDER — SACUBITRIL-VALSARTAN 24-26 MG PO TABS
1.0000 | ORAL_TABLET | Freq: Two times a day (BID) | ORAL | Status: DC
  Administered 2024-03-11 – 2024-03-14 (×6): 1 via ORAL
  Filled 2024-03-11 (×7): qty 1

## 2024-03-11 MED ORDER — IOHEXOL 350 MG/ML SOLN
75.0000 mL | Freq: Once | INTRAVENOUS | Status: AC | PRN
  Administered 2024-03-11: 75 mL via INTRAVENOUS

## 2024-03-11 MED ORDER — PRAVASTATIN SODIUM 40 MG PO TABS
40.0000 mg | ORAL_TABLET | Freq: Every day | ORAL | Status: DC
  Administered 2024-03-12 – 2024-03-13 (×2): 40 mg via ORAL
  Filled 2024-03-11 (×2): qty 1

## 2024-03-11 MED ORDER — DIGOXIN 125 MCG PO TABS
0.1250 mg | ORAL_TABLET | Freq: Every day | ORAL | Status: DC
  Administered 2024-03-12 – 2024-03-14 (×3): 0.125 mg via ORAL
  Filled 2024-03-11 (×3): qty 1

## 2024-03-11 MED ORDER — HYDRALAZINE HCL 20 MG/ML IJ SOLN: 10.0000 mg | INTRAMUSCULAR | Status: DC | PRN

## 2024-03-11 MED ORDER — ONDANSETRON HCL 4 MG/2ML IJ SOLN: 4.0000 mg | Freq: Four times a day (QID) | INTRAMUSCULAR | Status: DC | PRN

## 2024-03-11 MED ORDER — ALBUTEROL SULFATE (2.5 MG/3ML) 0.083% IN NEBU: 2.5000 mg | INHALATION_SOLUTION | Freq: Four times a day (QID) | RESPIRATORY_TRACT | Status: DC | PRN

## 2024-03-11 MED ORDER — METOPROLOL SUCCINATE ER 25 MG PO TB24
25.0000 mg | ORAL_TABLET | Freq: Every day | ORAL | Status: DC
  Administered 2024-03-12 – 2024-03-14 (×3): 25 mg via ORAL
  Filled 2024-03-11 (×3): qty 1

## 2024-03-11 MED ORDER — VANCOMYCIN HCL 2000 MG/400ML IV SOLN
2000.0000 mg | Freq: Once | INTRAVENOUS | Status: AC
  Administered 2024-03-11: 2000 mg via INTRAVENOUS
  Filled 2024-03-11: qty 400

## 2024-03-11 MED ORDER — SODIUM CHLORIDE 0.9% FLUSH
3.0000 mL | Freq: Two times a day (BID) | INTRAVENOUS | Status: DC
  Administered 2024-03-11 – 2024-03-14 (×6): 3 mL via INTRAVENOUS

## 2024-03-11 MED ORDER — ONDANSETRON HCL 4 MG PO TABS: 4.0000 mg | ORAL_TABLET | Freq: Four times a day (QID) | ORAL | Status: DC | PRN

## 2024-03-11 MED ORDER — AMIODARONE HCL 200 MG PO TABS
200.0000 mg | ORAL_TABLET | Freq: Every day | ORAL | Status: DC
  Administered 2024-03-12 – 2024-03-14 (×3): 200 mg via ORAL
  Filled 2024-03-11 (×3): qty 1

## 2024-03-11 MED ORDER — FENOFIBRATE 54 MG PO TABS
54.0000 mg | ORAL_TABLET | Freq: Every day | ORAL | Status: DC
  Administered 2024-03-12 – 2024-03-14 (×3): 54 mg via ORAL
  Filled 2024-03-11 (×3): qty 1

## 2024-03-11 MED ORDER — HYDROCODONE-ACETAMINOPHEN 5-325 MG PO TABS
1.0000 | ORAL_TABLET | Freq: Four times a day (QID) | ORAL | Status: DC | PRN
  Administered 2024-03-11 – 2024-03-13 (×4): 1 via ORAL
  Filled 2024-03-11 (×5): qty 1

## 2024-03-11 MED ORDER — VANCOMYCIN HCL IN DEXTROSE 1-5 GM/200ML-% IV SOLN
1000.0000 mg | Freq: Two times a day (BID) | INTRAVENOUS | Status: DC
  Administered 2024-03-11 – 2024-03-13 (×5): 1000 mg via INTRAVENOUS
  Filled 2024-03-11 (×5): qty 200

## 2024-03-11 MED ORDER — SPIRONOLACTONE 12.5 MG HALF TABLET
12.5000 mg | ORAL_TABLET | Freq: Every day | ORAL | Status: DC
  Administered 2024-03-12 – 2024-03-14 (×3): 12.5 mg via ORAL
  Filled 2024-03-11 (×3): qty 1

## 2024-03-11 MED ORDER — VANCOMYCIN HCL IN DEXTROSE 1-5 GM/200ML-% IV SOLN: 1000.0000 mg | Freq: Once | INTRAVENOUS | Status: DC

## 2024-03-11 MED ORDER — APIXABAN 5 MG PO TABS
5.0000 mg | ORAL_TABLET | Freq: Two times a day (BID) | ORAL | Status: DC
  Administered 2024-03-11 – 2024-03-14 (×6): 5 mg via ORAL
  Filled 2024-03-11 (×6): qty 1

## 2024-03-11 NOTE — ED Triage Notes (Signed)
 Pt here for a second opinion of an abscess on his neck. Pt seen at [facility] yesterday and given abx.

## 2024-03-11 NOTE — ED Provider Notes (Signed)
 [place] [facility] AT [facility] Provider Note   CSN: [id number] Arrival date & time: 03/11/24  9060     Patient presents with: Abscess  HPI Gary Lawrence is a 63 y.o. male presenting for abscess about the posterior scalp.  He states he noticed it last week.  Reports that this past Thursday he noticed that it popped and reports a significant amount of pustulant discharge.  Went to urgent care yesterday and was diagnosed with a large abscess and sent home with Bactrim and advised to go to the ED if his symptoms were worse.  He is here primarily for a second opinion.  Endorses some tenderness but able to move his neck without issue.  Denies fever at home.  {Add pertinent medical, surgical, social history, OB history to HPI:32947}  Abscess      Prior to Admission medications   Medication Sig Start Date End Date Taking? Authorizing Provider  ACCU-CHEK GUIDE test strip USE 1 STRIP ONCE DAILY FOR 90 DAYS 05/30/20   [provider]  amiodarone  (PACERONE ) 200 MG tablet Take 1 tablet (200 mg total) by mouth daily. 02/03/24   O'NealDarryle Ned, MD  apixaban  (ELIQUIS ) 5 MG TABS tablet Take 1 tablet (5 mg total) by mouth 2 (two) times daily. 01/31/24   O'NealDarryle Ned, MD  brimonidine  (ALPHAGAN ) 0.15 % ophthalmic solution Apply 1 drop to eye as directed. 09/29/23   [provider]  Cholecalciferol (VITAMIN D3) 50 MCG (2000 UT) TABS Take 2,000 Units by mouth daily.    [provider]  cycloSPORINE (RESTASIS) 0.05 % ophthalmic emulsion Place 1 drop into both eyes 2 (two) times daily.    [provider]  digoxin  (LANOXIN ) 0.125 MG tablet Take 1 tablet (0.125 mg total) by mouth daily. 01/31/24   O'NealDarryle Ned, MD  diphenhydrAMINE (BENADRYL) 25 mg capsule Take 25 mg by mouth every 6 (six) hours as needed. Patient not taking: Reported on 03/10/2024    [provider]  empagliflozin  (JARDIANCE ) 10 MG TABS tablet Take 1  tablet (10 mg total) by mouth daily. 01/31/24   O'NealDarryle Ned, MD  fenofibrate (TRICOR) 48 MG tablet Take 48 mg by mouth daily.    [provider]  ferrous sulfate 324 MG TBEC Take 324 mg by mouth daily.    [provider]  fluticasone (FLONASE) 50 MCG/ACT nasal spray Place 1 spray into both nostrils daily.    [provider]  glipiZIDE (GLUCOTROL XL) 2.5 MG 24 hr tablet Take 2.5 mg by mouth daily with breakfast.    [provider]  lidocaine  (LIDODERM ) 5 % Place 1 patch onto the skin daily. Remove & Discard patch within 12 hours or as directed by MD 12/29/23   Cheryle Page, MD  loratadine (CLARITIN) 10 MG tablet Take 10 mg by mouth daily.    [provider]  lovastatin (MEVACOR) 40 MG tablet Take 40 mg by mouth daily. 06/30/23   [provider]  metoprolol  succinate (TOPROL -XL) 25 MG 24 hr tablet Take 1 tablet (25 mg total) by mouth daily. 01/31/24   O'NealDarryle Ned, MD  pantoprazole  (PROTONIX ) 40 MG tablet Take 40 mg by mouth daily.    [provider]  sacubitril -valsartan  (ENTRESTO ) 24-26 MG Take 1 tablet by mouth 2 (two) times daily. 01/31/24   O'NealDarryle Ned, MD  sildenafil (VIAGRA) 100 MG tablet Take 100 mg by mouth as needed. 09/29/23   [provider]  spironolactone  (ALDACTONE ) 25 MG  tablet Take 0.5 tablets (12.5 mg total) by mouth daily. 01/31/24   O'NealDarryle Ned, MD  sulfamethoxazole-trimethoprim (BACTRIM DS) 800-160 MG tablet Take 1 tablet by mouth 2 (two) times daily for 10 days. 03/10/24 03/20/24  Andra Corean BROCKS, PA-C    Allergies: Honey bee venom, Other, and Milk (cow)    Review of Systems See HPI  Updated Vital Signs BP (!) 159/102 (BP Location: Right Arm)   Pulse 64   Temp 97.6 F (36.4 C)   Resp 16   Wt 108 kg   SpO2 100%   BMI 26.81 kg/m   Physical Exam  (Picture taken at Urgent Care yesterday)   (all labs ordered are listed, but only abnormal results are  displayed) Labs Reviewed  BASIC METABOLIC PANEL WITH GFR  CBC    EKG: None  Radiology: No results found.  {Document cardiac monitor, telemetry assessment procedure when appropriate:32947} Procedures   Medications Ordered in the ED  vancomycin (VANCOCIN) IVPB 1000 mg/200 mL premix (has no administration in time range)      {Click here for ABCD2, HEART and other calculators REFRESH Note before signing:1}                              Medical Decision Making Amount and/or Complexity of Data Reviewed Labs: ordered. Radiology: ordered.  Risk Prescription drug management.   ***  {Document critical care time when appropriate  Document review of labs and clinical decision tools ie CHADS2VASC2, etc  Document your independent review of radiology images and any outside records  Document your discussion with family members, caretakers and with consultants  Document social determinants of health affecting pt's care  Document your decision making why or why not admission, treatments were needed:32947:::1}   Final diagnoses:  None    ED Discharge Orders     None

## 2024-03-11 NOTE — ED Provider Notes (Signed)
  Physical Exam  BP (!) 143/84   Pulse (!) 55   Temp (!) 97.3 F (36.3 C) (Oral)   Resp 16   Wt 108 kg   SpO2 97%   BMI 26.81 kg/m   Physical Exam  Procedures  Procedures  ED Course / MDM    Medical Decision Making Amount and/or Complexity of Data Reviewed Labs: ordered. Radiology: ordered.  Risk Prescription drug management. Decision regarding hospitalization.   ***

## 2024-03-11 NOTE — Consult Note (Signed)
 WOC Nurse Consult Note: Reason for Consult:  posterior neck wound  Wound type: full thickness infectious draining abscess  Pressure Injury POA: NA  Measurement: see nursing flowsheet  Wound bed: appears largely pink moist  Drainage (amount, consistency, odor) purulent  Periwound: edema erythema  Dressing procedure/placement/frequency: Cleanse neck wound with Vashe, do not rinse and allow to air dry.  Apply Xeroform gauze ([employer] [phone]) to wound bed daily, cover with dry gauze and secure with silicone foam or clothe tape whichever works best. Change daily and as needed for drainage.       POC discussed with bedside nurse. WOC team will not follow. Re-consult if further needs arise.    Thank you,    Powell Bar MSN, RN-BC, [employer]

## 2024-03-11 NOTE — Progress Notes (Signed)
 Pharmacy Antibiotic Note  Gary Lawrence is a 63 y.o. male admitted on 03/11/2024 with abscess.  Pharmacy has been consulted for vancomycin dosing.  Plan: Vancomycin 2g IV x 1, then 1g IV q 12h (eAUC 483) Monitor renal function, Cx and clinical progression to narrow Vancomycin levels as indicated  Weight: 108 kg (238 lb)  Temp (24hrs), Avg:97.6 F (36.4 C), Min:97.3 F (36.3 C), Max:98 F (36.7 C)  Recent Labs  Lab 03/11/24 1122  WBC 8.1  CREATININE 1.66*    Estimated Creatinine Clearance: 58.8 mL/min (A) (by C-G formula based on SCr of 1.66 mg/dL (H)).    Allergies  Allergen Reactions   Honey Bee Venom Anaphylaxis   Other Cough    Unknown to patient   Milk (Cow)     Other reaction(s): VOMITING  Other Reaction(s): GI intolerance    Dorn Poot, PharmD, [facility] Clinical Pharmacist ED Pharmacist Phone # [phone] 03/11/2024 4:31 PM

## 2024-03-11 NOTE — Plan of Care (Signed)

## 2024-03-11 NOTE — H&P (Signed)
 History and Physical    Patient: Gary Lawrence FMW:[id number] DOB: [birth year] DOA: 03/11/2024 DOS: the patient was seen and examined on 03/11/2024 PCP: [name], FNP  Patient coming from: Home  Chief Complaint:  Chief Complaint  Patient presents with   Abscess   HPI: Gary Lawrence is a 63 y.o. male with medical history significant of CAD/remote stent, A-fib/flutter on low-dose Xarelto , oropharyngeal cancer, HTN, GERD, NIDDM-2, CKD stage IIIb, history of folliculitis, and OSA on CPAP who presents with a draining cyst on the neck.  Approximately seven to eight days ago, he noticed a painful bump on his neck on the left-hand side after his wife cut his hair. Initially, he felt a single bump, which then multiplied. He applied a lidocaine  patch for pain relief, which led to drainage when removed. The drainage was described as purulent and milky.  He visited urgent care yesterday, where he received a Rocephin injection and was prescribed doxycycline  to be taken every twelve hours. He started the medication regimen at 11:00 AM yesterday.  He experiences fevers, chills, and sweats, but no nausea. He has been forcing himself to eat despite a lack of appetite.  He has a history of thyroid and lymph node cancer. He recalls a similar incident with a boil on his neck two to three years ago, which resolved with home remedies.  He is currently on metformin and glipizide for blood sugar management. 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  In the emergency room patient noted to be afebrile with blood pressure elevated at 159/102, and all other vital signs maintained.  Labs and WBC 8.1, hemoglobin 11, platelets 445, BUN 16, creatinine 1.66, and glucose 133.  CT soft tissue's of the neck did not reveal diffuse cellulitis of the left posterior neck with involvement of the superficial musculature and deep cervical fascia without discrete abscess.    Review  of Systems: As mentioned in the history of present illness. All other systems reviewed and are negative. Past Medical History:  Diagnosis Date   A-fib [facility])    Status post ablation   Cancer (HCC)    Tonsils   Thyroid disease    Past Surgical History:  Procedure Laterality Date   HEMORRHOIDECTOMY WITH HEMORRHOID BANDING     NECK SURGERY     Neck dissection   RIGHT/LEFT HEART CATH AND CORONARY ANGIOGRAPHY N/A 12/27/2023   Procedure: RIGHT/LEFT HEART CATH AND CORONARY ANGIOGRAPHY;  Surgeon: Verlin Lonni BIRCH, MD;  Location: [facility] INVASIVE CV LAB;  Service: Cardiovascular;  Laterality: N/A;   Social History:  reports that he has never smoked. He has never used smokeless tobacco. He reports that he does not drink alcohol and does not use drugs.  Allergies  Allergen Reactions   Honey Bee Venom Anaphylaxis   Other Cough    Unknown to patient   Milk (Cow)     Other reaction(s): VOMITING  Other Reaction(s): GI intolerance    Family History  Problem Relation Age of Onset   Lupus Mother     Prior to Admission medications   Medication Sig Start Date End Date Taking? Authorizing Provider  ACCU-CHEK GUIDE test strip USE 1 STRIP ONCE DAILY FOR 90 DAYS 05/30/20   [provider]  amiodarone  (PACERONE ) 200 MG tablet Take 1 tablet (200 mg total) by mouth daily. 02/03/24   O'NealDarryle Ned, MD  apixaban  (ELIQUIS ) 5 MG TABS tablet Take 1 tablet (5 mg total) by mouth 2 (two) times  daily. 01/31/24   O'NealDarryle Ned, MD  brimonidine  (ALPHAGAN ) 0.15 % ophthalmic solution Apply 1 drop to eye as directed. 09/29/23   [provider]  Cholecalciferol (VITAMIN D3) 50 MCG (2000 UT) TABS Take 2,000 Units by mouth daily.    [provider]  cycloSPORINE (RESTASIS) 0.05 % ophthalmic emulsion Place 1 drop into both eyes 2 (two) times daily.    [provider]  digoxin  (LANOXIN ) 0.125 MG tablet Take 1 tablet (0.125 mg total) by mouth daily. 01/31/24   O'NealDarryle Ned, MD  diphenhydrAMINE (BENADRYL) 25 mg capsule Take 25 mg by mouth every 6 (six) hours as needed. Patient not taking: Reported on 03/10/2024    [provider]  empagliflozin  (JARDIANCE ) 10 MG TABS tablet Take 1 tablet (10 mg total) by mouth daily. 01/31/24   O'NealDarryle Ned, MD  fenofibrate (TRICOR) 48 MG tablet Take 48 mg by mouth daily.    [provider]  ferrous sulfate 324 MG TBEC Take 324 mg by mouth daily.    [provider]  fluticasone (FLONASE) 50 MCG/ACT nasal spray Place 1 spray into both nostrils daily.    [provider]  glipiZIDE (GLUCOTROL XL) 2.5 MG 24 hr tablet Take 2.5 mg by mouth daily with breakfast.    [provider]  lidocaine  (LIDODERM ) 5 % Place 1 patch onto the skin daily. Remove & Discard patch within 12 hours or as directed by MD 12/29/23   Cheryle Page, MD  loratadine (CLARITIN) 10 MG tablet Take 10 mg by mouth daily.    [provider]  lovastatin (MEVACOR) 40 MG tablet Take 40 mg by mouth daily. 06/30/23   [provider]  metoprolol  succinate (TOPROL -XL) 25 MG 24 hr tablet Take 1 tablet (25 mg total) by mouth daily. 01/31/24   O'NealDarryle Ned, MD  pantoprazole  (PROTONIX ) 40 MG tablet Take 40 mg by mouth daily.    [provider]  sacubitril -valsartan  (ENTRESTO ) 24-26 MG Take 1 tablet by mouth 2 (two) times daily. 01/31/24   O'NealDarryle Ned, MD  sildenafil (VIAGRA) 100 MG tablet Take 100 mg by mouth as needed. 09/29/23   [provider]  spironolactone  (ALDACTONE ) 25 MG tablet Take 0.5 tablets (12.5 mg total) by mouth daily. 01/31/24   O'NealDarryle Ned, MD  sulfamethoxazole-trimethoprim (BACTRIM DS) 800-160 MG tablet Take 1 tablet by mouth 2 (two) times daily for 10 days. 03/10/24 03/20/24  Andra Corean BROCKS, PA-C    Physical Exam: Vitals:   03/11/24 0942 03/11/24 0959 03/11/24 1430  BP: (!) 159/102  (!) 143/84  Pulse: 64  (!) 55  Resp: 16  16  Temp:  97.6 F (36.4 C)  (!) 97.3 F (36.3 C)  TempSrc:   Oral  SpO2: 100%  97%  Weight:  108 kg     Constitutional: Older adult male currently in no acute distress Eyes: PERRL, lids and conjunctivae normal ENMT: Mucous membranes are moist. Normal dentition.  Neck: Large wound of the posterior aspect of the neck.  No JVD Respiratory: clear to auscultation bilaterally, no wheezing, no crackles. No accessory muscle use.  Cardiovascular: Regular rate and rhythm, no murmurs / rubs / gallops. No extremity edema.  Abdomen: no tenderness, no masses palpated. . Bowel sounds positive.  Musculoskeletal: no clubbing / cyanosis. No joint deformity upper and lower extremities. Good ROM, no contractures. Normal muscle tone.  Skin: Inflammation noted with raised red bumps present as pictured below with open wound and purulent drainage  present Neurologic: CN 2-12 grossly intact. Strength 5/5 in all 4.  Psychiatric: Normal judgment and insight. Alert and oriented x 3. Normal mood.   Data Reviewed:    Assessment and Plan:  Folliculitis Cellulitis of the neck Acute.  Patient presents with swelling and drainage from the left posterior side of his neck after getting his haircut 7-8 days ago by his wife.  Noted to have open wound with purulent drainage present..  CT skin and soft tissue send negative revealed diffuse cellulitis of the left posterior lateral neck with involvement of the superficial muscular and deep cervical fascia without discrete abscess.  Patient had been started on empiric antibiotics of vancomycin - Admit to a telemetry bed - Add on ESR and CRP - Continue empiric antibiotics vancomycin per pharmacy - Hydrocodone  as needed for pain - Wound care consulted  Heart failure with reduced ejection fraction Chronic.  Patient appears to be euvolemic on physical exam. Last echocardiogram noted EF to be 35 to 40%, global hypokinesis, mild LVH, grade 1 DD, and normal RV function.  - Continue heart  failure goal-directed medical therapy digoxin  0.125 mg daily, metoprolol  succinate 25 mg daily, spironolactone  12.5 mg daily, Jardiance  10 mg daily, Entresto  24-26 mg twice daily   Paroxysmal atrial fibrillation/flutter Patient appears to be in a sinus rhythm at this time.  Patient with prior history of  cryoballoon pulmonary vein isolation and radiofrequency ablation of the cavotricuspid isthmus back in September 2018 at [facility]. - Continue amiodarone , digoxin , metoprolol , and Eliquis   Controlled diabetes mellitus type 2, without long-term use of insulin  Last available hemoglobin A1c noted to be 6.5 - Continue Jardiance  - Held glipizide  Essential hypertension Blood pressures elevated up to 161/80. - Continue current medication regimen - Hydralazine  IV as needed for elevated blood pressure  CAD Patient with remote history of stent.  Most recent cardiac catheterization on 12/27/2023 showed mild nonobstructive CAD.  For which medical management was recommended. - Continue statin and fenofibrate  Chronic kidney disease stage IIIb Creatinine noted to be 1.66 which appears around patient's baseline. - Continue to monitor  History of oropharyngeal cancer Patient with history of stage IV oropharyngeal cancer, s/p chemotherapy and salvage neck dissection in 2011.  Mass noted follow-up with ENT oncology back in 2024.  OSA on CPAP - Continue CPAP nightly  DVT prophylaxis: Lovenox  Advance Care Planning:   Code Status: Full Code    Consults: None  Family Communication:  None   Severity of Illness: The appropriate patient status for this patient is INPATIENT. Inpatient status is judged to be reasonable and necessary in order to provide the required intensity of service to ensure the patient's safety. The patient's presenting symptoms, physical exam findings, and initial radiographic and laboratory data in the context of their chronic comorbidities is felt to place them at high risk for further  clinical deterioration. Furthermore, it is not anticipated that the patient will be medically stable for discharge from the hospital within 2 midnights of admission.   * I certify that at the point of admission it is my clinical judgment that the patient will require inpatient hospital care spanning beyond 2 midnights from the point of admission due to high intensity of service, high risk for further deterioration and high frequency of surveillance required.*  Author: Maximino DELENA Sharps, MD 03/11/2024 3:49 PM  For on call review [email or web address].

## 2024-03-12 ENCOUNTER — Ambulatory Visit (HOSPITAL_COMMUNITY): Payer: Self-pay

## 2024-03-12 DIAGNOSIS — L03221 Cellulitis of neck: Secondary | ICD-10-CM | POA: Diagnosis not present

## 2024-03-12 LAB — C-REACTIVE PROTEIN: CRP: 1.8 mg/dL — ABNORMAL HIGH (ref <1.0)

## 2024-03-12 LAB — CBC
HCT: 32.9 % — ABNORMAL LOW (ref 39.0–52.0)
Hemoglobin: 10.6 g/dL — ABNORMAL LOW (ref 13.0–17.0)
MCH: 26.8 pg (ref 26.0–34.0)
MCHC: 32.2 g/dL (ref 30.0–36.0)
MCV: 83.3 fL (ref 80.0–100.0)
Platelets: 455 K/uL — ABNORMAL HIGH (ref 150–400)
RBC: 3.95 MIL/uL — ABNORMAL LOW (ref 4.22–5.81)
RDW: 14.2 % (ref 11.5–15.5)
WBC: 9.2 K/uL (ref 4.0–10.5)
nRBC: 0 % (ref 0.0–0.2)

## 2024-03-12 LAB — AEROBIC CULTURE W GRAM STAIN (SUPERFICIAL SPECIMEN)

## 2024-03-12 LAB — BASIC METABOLIC PANEL WITH GFR
Anion gap: 15 (ref 5–15)
BUN: 15 mg/dL (ref 8–23)
CO2: 26 mmol/L (ref 22–32)
Calcium: 9.2 mg/dL (ref 8.9–10.3)
Chloride: 101 mmol/L (ref 98–111)
Creatinine, Ser: 1.49 mg/dL — ABNORMAL HIGH (ref 0.61–1.24)
GFR, Estimated: 52 mL/min — ABNORMAL LOW (ref >60)
Glucose, Bld: 114 mg/dL — ABNORMAL HIGH (ref 70–99)
Potassium: 3.8 mmol/L (ref 3.5–5.1)
Sodium: 142 mmol/L (ref 135–145)

## 2024-03-12 LAB — SEDIMENTATION RATE: Sed Rate: 52 mm/h — ABNORMAL HIGH (ref 0–16)

## 2024-03-12 MED ORDER — GLUCAGON HCL RDNA (DIAGNOSTIC) 1 MG IJ SOLR: 1.0000 mg | INTRAMUSCULAR | Status: DC | PRN

## 2024-03-12 MED ORDER — IPRATROPIUM-ALBUTEROL 0.5-2.5 (3) MG/3ML IN SOLN
3.0000 mL | Freq: Four times a day (QID) | RESPIRATORY_TRACT | Status: DC
  Filled 2024-03-12: qty 3

## 2024-03-12 MED ORDER — HYDRALAZINE HCL 20 MG/ML IJ SOLN: 10.0000 mg | INTRAMUSCULAR | Status: DC | PRN

## 2024-03-12 MED ORDER — METOPROLOL TARTRATE 5 MG/5ML IV SOLN: 5.0000 mg | INTRAVENOUS | Status: DC | PRN

## 2024-03-12 NOTE — TOC CM/SW Note (Signed)
 Transition of Care [facility]) - Inpatient Brief Assessment   Patient Details  Name: Gary Lawrence MRN: [id number] Date of Birth: 04/04/1961  Transition of Care [facility]) CM/SW Contact:    Lauraine FORBES Saa, LCSWA Phone Number: 03/12/2024, 10:03 AM   Clinical Narrative:  10:03 AM Per chart review, patient resides at home. Patient has a PCP and insurance. Patient does not have SNF or HH history. Patient has DME (CPAP) history. Patient's preferred pharmacy is [facility] [address]. No TOC needs identified at this time. TOC will continue to follow.  Transition of Care Asessment: Insurance and Status: Insurance coverage has been reviewed Patient has primary care physician: Yes Home environment has been reviewed: Private Residence Prior level of function:: N/A Prior/Current Home Services: No current home services Social Drivers of Health Review: SDOH reviewed no interventions necessary Readmission risk has been reviewed: Yes (Currently Yellow 20%) Transition of care needs: no transition of care needs at this time

## 2024-03-12 NOTE — Hospital Course (Addendum)
 Brief Narrative:  63 y.o. male with medical history significant of CAD/remote stent, A-fib/flutter on low-dose Xarelto , oropharyngeal cancer, HTN, GERD, NIDDM-2, CKD stage IIIb, history of folliculitis, and OSA on CPAP who presents with a draining cyst on the neck.  CT soft tissue's of the neck did not reveal diffuse cellulitis of the left posterior neck with involvement of the superficial musculature and deep cervical fascia without discrete abscess.    Assessment & Plan:   Folliculitis Cellulitis of the neck Diffuse neck cellulitis involving deep cervical fascia without any abscess.  Outpatient cultures are growing MRSA therefore on vancomycin Continue routine wound care   Heart failure with reduced ejection fraction, 40% Chronic.  Overall appears to be euvolemic.  Continue home GDMT   Paroxysmal atrial fibrillation/flutter -Currently on amiodarone , digoxin , Eliquis , Toprol -XL   Controlled diabetes mellitus type 2, without long-term use of insulin  Last available hemoglobin A1c noted to be 6.5 - Continue Jardiance  - Held glipizide   Essential hypertension -Cardiac medications as mentioned above.  IV as needed   CAD Remote history of stent.  Currently chest pain-free.  Continue statin and fenofibrate   Chronic kidney disease stage IIIb Creatinine noted to be 1.66 which appears around patient's baseline. - Continue to monitor   History of oropharyngeal cancer Patient with history of stage IV oropharyngeal cancer, s/p chemotherapy and salvage neck dissection in 2011.  Mass noted follow-up with ENT oncology back in 2024.   OSA on CPAP - Continue CPAP nightly   DVT prophylaxis: apixaban  (ELIQUIS ) tablet 5 mg     Code Status: Full Code Family Communication:   Status is: Inpatient Remains inpatient appropriate because: Hopefully discharge in next 24-48 hours  PT Follow up Recs:   Subjective:  Still having neck pain and discomfort.  This is slowly improving at this  time  Examination:  General exam: Appears calm and comfortable  Respiratory system: Clear to auscultation. Respiratory effort normal. Cardiovascular system: S1 & S2 heard, RRR. No JVD, murmurs, rubs, gallops or clicks. No pedal edema. Gastrointestinal system: Abdomen is nondistended, soft and nontender. No organomegaly or masses felt. Normal bowel sounds heard. Central nervous system: Alert and oriented. No focal neurological deficits. Extremities: Symmetric 5 x 5 power. Skin: Dressing over his scalp noted Psychiatry: Judgement and insight appear normal. Mood & affect appropriate.

## 2024-03-12 NOTE — Progress Notes (Signed)
 PROGRESS NOTE    Gary Lawrence  FMW:[id number] DOB: 10-16-1960 DOA: 03/11/2024 PCP: [name], FNP    Brief Narrative:  63 y.o. male with medical history significant of CAD/remote stent, A-fib/flutter on low-dose Xarelto , oropharyngeal cancer, HTN, GERD, NIDDM-2, CKD stage IIIb, history of folliculitis, and OSA on CPAP who presents with a draining cyst on the neck.  CT soft tissue's of the neck did not reveal diffuse cellulitis of the left posterior neck with involvement of the superficial musculature and deep cervical fascia without discrete abscess.    Assessment & Plan:   Folliculitis Cellulitis of the neck Diffuse neck cellulitis involving deep cervical fascia without any abscess.  Continue IV vancomycin, will add Zosyn.  Clinically monitor   Heart failure with reduced ejection fraction, 40% Chronic.  Overall appears to be euvolemic.  Continue home GDMT   Paroxysmal atrial fibrillation/flutter -Currently on amiodarone , digoxin , Eliquis , Toprol -XL   Controlled diabetes mellitus type 2, without long-term use of insulin  Last available hemoglobin A1c noted to be 6.5 - Continue Jardiance  - Held glipizide   Essential hypertension -Cardiac medications as mentioned above.  IV as needed   CAD Remote history of stent.  Currently chest pain-free.  Continue statin and fenofibrate   Chronic kidney disease stage IIIb Creatinine noted to be 1.66 which appears around patient's baseline. - Continue to monitor   History of oropharyngeal cancer Patient with history of stage IV oropharyngeal cancer, s/p chemotherapy and salvage neck dissection in 2011.  Mass noted follow-up with ENT oncology back in 2024.   OSA on CPAP - Continue CPAP nightly   DVT prophylaxis: apixaban  (ELIQUIS ) tablet 5 mg     Code Status: Full Code Family Communication:   Status is: Inpatient Remains inpatient appropriate because: Continue hospital stay for antibiotics   PT Follow up Recs:    Subjective:  Still having neck pain and discomfort.  Examination:  General exam: Appears calm and comfortable  Respiratory system: Clear to auscultation. Respiratory effort normal. Cardiovascular system: S1 & S2 heard, RRR. No JVD, murmurs, rubs, gallops or clicks. No pedal edema. Gastrointestinal system: Abdomen is nondistended, soft and nontender. No organomegaly or masses felt. Normal bowel sounds heard. Central nervous system: Alert and oriented. No focal neurological deficits. Extremities: Symmetric 5 x 5 power. Skin: Dressing over his scalp noted Psychiatry: Judgement and insight appear normal. Mood & affect appropriate.                Diet Orders (From admission, onward)     Start     Ordered   03/11/24 1617  Diet regular Room service appropriate? Yes; Fluid consistency: Thin  Diet effective now       Question Answer Comment  Room service appropriate? Yes   Fluid consistency: Thin      03/11/24 1616            Objective: Vitals:   03/11/24 1717 03/11/24 2300 03/12/24 0757 03/12/24 1000  BP:  (!) 157/81 (!) 142/74 118/63  Pulse:   81 63  Resp:  18 17 16   Temp:  98.9 F (37.2 C) 98.7 F (37.1 C) 98.4 F (36.9 C)  TempSrc:  Oral Oral Oral  SpO2:  100% 100% 98%  Weight:      Height: 6' 7 (2.007 m)       Intake/Output Summary (Last 24 hours) at 03/12/2024 1241 Last data filed at 03/12/2024 0300 Gross per 24 hour  Intake 123 ml  Output --  Net 123 ml  Filed Weights   03/11/24 0959  Weight: 108 kg    Scheduled Meds:  amiodarone   200 mg Oral Daily   apixaban   5 mg Oral BID   digoxin   0.125 mg Oral Daily   empagliflozin   10 mg Oral Daily   fenofibrate  54 mg Oral Daily   ipratropium-albuterol   3 mL Nebulization Q6H   metoprolol  succinate  25 mg Oral Daily   pravastatin  40 mg Oral q1800   sacubitril -valsartan   1 tablet Oral BID   sodium chloride  flush  3 mL Intravenous Q12H   spironolactone   12.5 mg Oral Daily   Continuous  Infusions:  vancomycin 1,000 mg (03/12/24 1037)    Nutritional status     Body mass index is 26.81 kg/m.  Data Reviewed:   CBC: Recent Labs  Lab 03/11/24 1122 03/12/24 0602  WBC 8.1 9.2  HGB 11.4* 10.6*  HCT 35.9* 32.9*  MCV 83.9 83.3  PLT 445* 455*   Basic Metabolic Panel: Recent Labs  Lab 03/11/24 1122 03/12/24 0602  NA 142 142  K 4.2 3.8  CL 104 101  CO2 29 26  GLUCOSE 131* 114*  BUN 16 15  CREATININE 1.66* 1.49*  CALCIUM  9.2 9.2   GFR: Estimated Creatinine Clearance: 65.5 mL/min (A) (by C-G formula based on SCr of 1.49 mg/dL (H)). Liver Function Tests: No results for input(s): AST, ALT, ALKPHOS, BILITOT, PROT, ALBUMIN in the last 168 hours. No results for input(s): LIPASE, AMYLASE in the last 168 hours. No results for input(s): AMMONIA in the last 168 hours. Coagulation Profile: No results for input(s): INR, PROTIME in the last 168 hours. Cardiac Enzymes: No results for input(s): CKTOTAL, CKMB, CKMBINDEX, TROPONINI in the last 168 hours. BNP (last 3 results) Recent Labs    12/24/23 1507  PROBNP 3,163.0*   HbA1C: No results for input(s): HGBA1C in the last 72 hours. CBG: No results for input(s): GLUCAP in the last 168 hours. Lipid Profile: No results for input(s): CHOL, HDL, LDLCALC, TRIG, CHOLHDL, LDLDIRECT in the last 72 hours. Thyroid Function Tests: No results for input(s): TSH, T4TOTAL, FREET4, T3FREE, THYROIDAB in the last 72 hours. Anemia Panel: No results for input(s): VITAMINB12, FOLATE, FERRITIN, TIBC, IRON, RETICCTPCT in the last 72 hours. Sepsis Labs: No results for input(s): PROCALCITON, LATICACIDVEN in the last 168 hours.  Recent Results (from the past 240 hours)  Aerobic Culture w Gram Stain (superficial specimen)     Status: None   Collection Time: 03/10/24 12:17 PM   Specimen: SCALP  Result Value Ref Range Status   Specimen Description SCALP  Final    Special Requests NONE  Final   Gram Stain   Final    RARE WBC PRESENT, PREDOMINANTLY PMN FEW GRAM POSITIVE COCCI RARE GRAM NEGATIVE RODS Performed at [facility], [address], [place], [place] [address]    Culture   Final    MODERATE METHICILLIN RESISTANT STAPHYLOCOCCUS AUREUS   Report Status 03/12/2024 FINAL  Final   Organism ID, Bacteria METHICILLIN RESISTANT STAPHYLOCOCCUS AUREUS  Final      Susceptibility   Methicillin resistant staphylococcus aureus - MIC*    CIPROFLOXACIN 4 RESISTANT Resistant     ERYTHROMYCIN >=8 RESISTANT Resistant     GENTAMICIN <=0.5 SENSITIVE Sensitive     OXACILLIN >=4 RESISTANT Resistant     TETRACYCLINE <=1 SENSITIVE Sensitive     VANCOMYCIN 1 SENSITIVE Sensitive     TRIMETH/SULFA >=320 RESISTANT Resistant     CLINDAMYCIN <=0.25 SENSITIVE Sensitive  RIFAMPIN <=0.5 SENSITIVE Sensitive     Inducible Clindamycin NEGATIVE Sensitive     LINEZOLID 2 SENSITIVE Sensitive     * MODERATE METHICILLIN RESISTANT STAPHYLOCOCCUS AUREUS         Radiology Studies: CT Soft Tissue Neck W Contrast Result Date: 03/11/2024 EXAM: CT NECK WITH CONTRAST 03/11/2024 02:43:11 PM TECHNIQUE: CT of the neck was performed with the administration of 75 mL iohexol  (OMNIPAQUE ) 350 MG/ML injection. Multiplanar reformatted images are provided for review. Automated exposure control, iterative reconstruction, and/or weight based adjustment of the mA/kV was utilized to reduce the radiation dose to as low as reasonably achievable. COMPARISON: None available. CLINICAL HISTORY: soft tissue infection FINDINGS: AERODIGESTIVE TRACT: No discrete mass. No edema. SALIVARY GLANDS: The parotid and submandibular glands are unremarkable. THYROID: Unremarkable. LYMPH NODES: No suspicious cervical lymphadenopathy. SOFT TISSUES: Diffuse skin thickening and subcutaneous stranding is present in the left posterolateral neck extending 10.9 cm cephalocaudad. The subcutaneous stranding is up to 3.1  cm deep. The area is 9.5 cm wide. There is some indistinctness of the deep cervical fascia and superficial layer of muscles. No discrete abscess is present. BRAIN, ORBITS, SINUSES AND MASTOIDS: No acute abnormality. LUNGS AND MEDIASTINUM: No acute abnormality. BONES: Multilevel degenerative changes are present in the cervical spine. Uncovertebral and foraminal stenosis is greatest at C4-C5, C5-C6, and C6-C7. Severe left foraminal narrowing is present at C3-C4. IMPRESSION: 1. Diffuse cellulitis of the left posterolateral neck with involvement of the superficial musculature and deep cervical fascia, without a discrete abscess. 2. 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 Electronically signed by: Lonni Necessary MD 03/11/2024 02:52 PM EST RP Workstation: [id number]           LOS: 1 day   Time spent= 35 mins    Burgess JAYSON Dare, MD [facility]  If 7PM-7AM, please contact night-coverage  03/12/2024, 12:41 PM

## 2024-03-12 NOTE — Progress Notes (Signed)
 Patient requested to shower. MD okay'd to shower. Sherline Jubilee

## 2024-03-13 DIAGNOSIS — L03221 Cellulitis of neck: Secondary | ICD-10-CM | POA: Diagnosis not present

## 2024-03-13 LAB — CBC
HCT: 33.7 % — ABNORMAL LOW (ref 39.0–52.0)
Hemoglobin: 11 g/dL — ABNORMAL LOW (ref 13.0–17.0)
MCH: 27 pg (ref 26.0–34.0)
MCHC: 32.6 g/dL (ref 30.0–36.0)
MCV: 82.6 fL (ref 80.0–100.0)
Platelets: 500 K/uL — ABNORMAL HIGH (ref 150–400)
RBC: 4.08 MIL/uL — ABNORMAL LOW (ref 4.22–5.81)
RDW: 14.3 % (ref 11.5–15.5)
WBC: 11.4 K/uL — ABNORMAL HIGH (ref 4.0–10.5)
nRBC: 0 % (ref 0.0–0.2)

## 2024-03-13 LAB — VANCOMYCIN, TROUGH: Vancomycin Tr: 16 ug/mL (ref 15–20)

## 2024-03-13 LAB — BASIC METABOLIC PANEL WITH GFR
Anion gap: 10 (ref 5–15)
BUN: 18 mg/dL (ref 8–23)
CO2: 26 mmol/L (ref 22–32)
Calcium: 9.2 mg/dL (ref 8.9–10.3)
Chloride: 100 mmol/L (ref 98–111)
Creatinine, Ser: 1.49 mg/dL — ABNORMAL HIGH (ref 0.61–1.24)
GFR, Estimated: 52 mL/min — ABNORMAL LOW (ref >60)
Glucose, Bld: 122 mg/dL — ABNORMAL HIGH (ref 70–99)
Potassium: 3.6 mmol/L (ref 3.5–5.1)
Sodium: 136 mmol/L (ref 135–145)

## 2024-03-13 LAB — VANCOMYCIN, PEAK: Vancomycin Pk: 48 ug/mL — ABNORMAL HIGH (ref 30–40)

## 2024-03-13 LAB — MAGNESIUM: Magnesium: 1.8 mg/dL (ref 1.7–2.4)

## 2024-03-13 MED ORDER — VANCOMYCIN HCL 750 MG/150ML IV SOLN
750.0000 mg | Freq: Two times a day (BID) | INTRAVENOUS | Status: DC
  Filled 2024-03-13: qty 150

## 2024-03-13 NOTE — Consult Note (Signed)
 WOC Nurse Consult Note: Reason for Consult: neck wound Abscess noted; history of DM Wound type infectious [place] to bedside nursing, no need needs for wound care, current wound care orders are up to date.    Re consult if needed, will not follow at this time. Thanks  Henritta [employer]z [employer], RN,CWOCN, CNS, [employer] [phone]

## 2024-03-13 NOTE — Progress Notes (Signed)
 Pharmacy Antibiotic Note  Alan Drummer is a 63 y.o. male admitted on 03/11/2024 with abscess.  Pharmacy has been consulted for vancomycin dosing.  Vancomycin peak 48 (drawn 5 min after infusion completed), vancomycin trough 16, AUC elevated above goal at 658.   Plan: Reduce Vancomycin 750mg  IV every 12 hours Monitor renal function, Cx and clinical progression to narrow Vancomycin levels as indicated  Height: 6' 7 (200.7 cm) Weight: 108 kg (238 lb) IBW/kg (Calculated) : 93.7  Temp (24hrs), Avg:98.5 F (36.9 C), Min:98.4 F (36.9 C), Max:98.6 F (37 C)  Recent Labs  Lab 03/11/24 1122 03/12/24 0602 03/13/24 0459 03/13/24 1223 03/13/24 2142  WBC 8.1 9.2 11.4*  --   --   CREATININE 1.66* 1.49* 1.49*  --   --   VANCOTROUGH  --   --   --   --  16  VANCOPEAK  --   --   --  48*  --     Estimated Creatinine Clearance: 65.5 mL/min (A) (by C-G formula based on SCr of 1.49 mg/dL (H)).    Allergies  Allergen Reactions   Honey Bee Venom Anaphylaxis   Other Cough    Unknown to patient   Milk (Cow)     Other reaction(s): VOMITING  Other Reaction(s): GI intolerance    Harlene Boga, PharmD, BCPS, BCCCP Please refer to [facility] for [facility] Pharmacy numbers 03/13/2024 10:52 PM

## 2024-03-13 NOTE — Progress Notes (Signed)
 PROGRESS NOTE    Gary Lawrence  FMW:[id number] DOB: 05/14/1960 DOA: 03/11/2024 PCP: [name], FNP    Brief Narrative:  63 y.o. male with medical history significant of CAD/remote stent, A-fib/flutter on low-dose Xarelto , oropharyngeal cancer, HTN, GERD, NIDDM-2, CKD stage IIIb, history of folliculitis, and OSA on CPAP who presents with a draining cyst on the neck.  CT soft tissue's of the neck did not reveal diffuse cellulitis of the left posterior neck with involvement of the superficial musculature and deep cervical fascia without discrete abscess.    Assessment & Plan:   Folliculitis Cellulitis of the neck Diffuse neck cellulitis involving deep cervical fascia without any abscess.  Outpatient cultures are growing MRSA therefore on vancomycin Continue routine wound care   Heart failure with reduced ejection fraction, 40% Chronic.  Overall appears to be euvolemic.  Continue home GDMT   Paroxysmal atrial fibrillation/flutter -Currently on amiodarone , digoxin , Eliquis , Toprol -XL   Controlled diabetes mellitus type 2, without long-term use of insulin  Last available hemoglobin A1c noted to be 6.5 - Continue Jardiance  - Held glipizide   Essential hypertension -Cardiac medications as mentioned above.  IV as needed   CAD Remote history of stent.  Currently chest pain-free.  Continue statin and fenofibrate   Chronic kidney disease stage IIIb Creatinine noted to be 1.66 which appears around patient's baseline. - Continue to monitor   History of oropharyngeal cancer Patient with history of stage IV oropharyngeal cancer, s/p chemotherapy and salvage neck dissection in 2011.  Mass noted follow-up with ENT oncology back in 2024.   OSA on CPAP - Continue CPAP nightly   DVT prophylaxis: apixaban  (ELIQUIS ) tablet 5 mg     Code Status: Full Code Family Communication:   Status is: Inpatient Remains inpatient appropriate because: Hopefully discharge in next 24-48  hours  PT Follow up Recs:   Subjective:  Still having neck pain and discomfort.  This is slowly improving at this time  Examination:  General exam: Appears calm and comfortable  Respiratory system: Clear to auscultation. Respiratory effort normal. Cardiovascular system: S1 & S2 heard, RRR. No JVD, murmurs, rubs, gallops or clicks. No pedal edema. Gastrointestinal system: Abdomen is nondistended, soft and nontender. No organomegaly or masses felt. Normal bowel sounds heard. Central nervous system: Alert and oriented. No focal neurological deficits. Extremities: Symmetric 5 x 5 power. Skin: Dressing over his scalp noted Psychiatry: Judgement and insight appear normal. Mood & affect appropriate.                Diet Orders (From admission, onward)     Start     Ordered   03/11/24 1617  Diet regular Room service appropriate? Yes; Fluid consistency: Thin  Diet effective now       Question Answer Comment  Room service appropriate? Yes   Fluid consistency: Thin      03/11/24 1616            Objective: Vitals:   03/12/24 0757 03/12/24 1000 03/12/24 1751 03/12/24 1942  BP: (!) 142/74 118/63 139/69 (!) 146/74  Pulse: 81 63 65 72  Resp: 17 16 17    Temp: 98.7 F (37.1 C) 98.4 F (36.9 C) 98.8 F (37.1 C)   TempSrc: Oral Oral Oral   SpO2: 100% 98% 98% 98%  Weight:      Height:        Intake/Output Summary (Last 24 hours) at 03/13/2024 1051 Last data filed at 03/12/2024 1800 Gross per 24 hour  Intake 500 ml  Output --  Net 500 ml   Filed Weights   03/11/24 0959  Weight: 108 kg    Scheduled Meds:  amiodarone   200 mg Oral Daily   apixaban   5 mg Oral BID   digoxin   0.125 mg Oral Daily   empagliflozin   10 mg Oral Daily   fenofibrate  54 mg Oral Daily   metoprolol  succinate  25 mg Oral Daily   pravastatin  40 mg Oral q1800   sacubitril -valsartan   1 tablet Oral BID   sodium chloride  flush  3 mL Intravenous Q12H   spironolactone   12.5 mg Oral Daily    Continuous Infusions:  vancomycin 1,000 mg (03/12/24 2327)    Nutritional status     Body mass index is 26.81 kg/m.  Data Reviewed:   CBC: Recent Labs  Lab 03/11/24 1122 03/12/24 0602 03/13/24 0459  WBC 8.1 9.2 11.4*  HGB 11.4* 10.6* 11.0*  HCT 35.9* 32.9* 33.7*  MCV 83.9 83.3 82.6  PLT 445* 455* 500*   Basic Metabolic Panel: Recent Labs  Lab 03/11/24 1122 03/12/24 0602 03/13/24 0459  NA 142 142 136  K 4.2 3.8 3.6  CL 104 101 100  CO2 29 26 26   GLUCOSE 131* 114* 122*  BUN 16 15 18   CREATININE 1.66* 1.49* 1.49*  CALCIUM  9.2 9.2 9.2  MG  --   --  1.8   GFR: Estimated Creatinine Clearance: 65.5 mL/min (A) (by C-G formula based on SCr of 1.49 mg/dL (H)). Liver Function Tests: No results for input(s): AST, ALT, ALKPHOS, BILITOT, PROT, ALBUMIN in the last 168 hours. No results for input(s): LIPASE, AMYLASE in the last 168 hours. No results for input(s): AMMONIA in the last 168 hours. Coagulation Profile: No results for input(s): INR, PROTIME in the last 168 hours. Cardiac Enzymes: No results for input(s): CKTOTAL, CKMB, CKMBINDEX, TROPONINI in the last 168 hours. BNP (last 3 results) Recent Labs    12/24/23 1507  PROBNP 3,163.0*   HbA1C: No results for input(s): HGBA1C in the last 72 hours. CBG: No results for input(s): GLUCAP in the last 168 hours. Lipid Profile: No results for input(s): CHOL, HDL, LDLCALC, TRIG, CHOLHDL, LDLDIRECT in the last 72 hours. Thyroid Function Tests: No results for input(s): TSH, T4TOTAL, FREET4, T3FREE, THYROIDAB in the last 72 hours. Anemia Panel: No results for input(s): VITAMINB12, FOLATE, FERRITIN, TIBC, IRON, RETICCTPCT in the last 72 hours. Sepsis Labs: No results for input(s): PROCALCITON, LATICACIDVEN in the last 168 hours.  Recent Results (from the past 240 hours)  Aerobic Culture w Gram Stain (superficial specimen)     Status: None    Collection Time: 03/10/24 12:17 PM   Specimen: SCALP  Result Value Ref Range Status   Specimen Description SCALP  Final   Special Requests NONE  Final   Gram Stain   Final    RARE WBC PRESENT, PREDOMINANTLY PMN FEW GRAM POSITIVE COCCI RARE GRAM NEGATIVE RODS Performed at [facility], [address], [place], [place] [address]    Culture   Final    MODERATE METHICILLIN RESISTANT STAPHYLOCOCCUS AUREUS   Report Status 03/12/2024 FINAL  Final   Organism ID, Bacteria METHICILLIN RESISTANT STAPHYLOCOCCUS AUREUS  Final      Susceptibility   Methicillin resistant staphylococcus aureus - MIC*    CIPROFLOXACIN 4 RESISTANT Resistant     ERYTHROMYCIN >=8 RESISTANT Resistant     GENTAMICIN <=0.5 SENSITIVE Sensitive     OXACILLIN >=4 RESISTANT Resistant     TETRACYCLINE <=1 SENSITIVE  Sensitive     VANCOMYCIN 1 SENSITIVE Sensitive     TRIMETH/SULFA >=320 RESISTANT Resistant     CLINDAMYCIN <=0.25 SENSITIVE Sensitive     RIFAMPIN <=0.5 SENSITIVE Sensitive     Inducible Clindamycin NEGATIVE Sensitive     LINEZOLID 2 SENSITIVE Sensitive     * MODERATE METHICILLIN RESISTANT STAPHYLOCOCCUS AUREUS         Radiology Studies: CT Soft Tissue Neck W Contrast Result Date: 03/11/2024 EXAM: CT NECK WITH CONTRAST 03/11/2024 02:43:11 PM TECHNIQUE: CT of the neck was performed with the administration of 75 mL iohexol  (OMNIPAQUE ) 350 MG/ML injection. Multiplanar reformatted images are provided for review. Automated exposure control, iterative reconstruction, and/or weight based adjustment of the mA/kV was utilized to reduce the radiation dose to as low as reasonably achievable. COMPARISON: None available. CLINICAL HISTORY: soft tissue infection FINDINGS: AERODIGESTIVE TRACT: No discrete mass. No edema. SALIVARY GLANDS: The parotid and submandibular glands are unremarkable. THYROID: Unremarkable. LYMPH NODES: No suspicious cervical lymphadenopathy. SOFT TISSUES: Diffuse skin thickening and subcutaneous  stranding is present in the left posterolateral neck extending 10.9 cm cephalocaudad. The subcutaneous stranding is up to 3.1 cm deep. The area is 9.5 cm wide. There is some indistinctness of the deep cervical fascia and superficial layer of muscles. No discrete abscess is present. BRAIN, ORBITS, SINUSES AND MASTOIDS: No acute abnormality. LUNGS AND MEDIASTINUM: No acute abnormality. BONES: Multilevel degenerative changes are present in the cervical spine. Uncovertebral and foraminal stenosis is greatest at C4-C5, C5-C6, and C6-C7. Severe left foraminal narrowing is present at C3-C4. IMPRESSION: 1. Diffuse cellulitis of the left posterolateral neck with involvement of the superficial musculature and deep cervical fascia, without a discrete abscess. 2. 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 Electronically signed by: Lonni Necessary MD 03/11/2024 02:52 PM EST RP Workstation: [id number]           LOS: 2 days   Time spent= 35 mins    Burgess JAYSON Dare, MD [facility]  If 7PM-7AM, please contact night-coverage  03/13/2024, 10:51 AM

## 2024-03-13 NOTE — Plan of Care (Signed)

## 2024-03-14 MED ORDER — LINEZOLID 600 MG PO TABS: 600.0000 mg | ORAL_TABLET | Freq: Two times a day (BID) | ORAL | 0 refills | Status: AC

## 2024-03-14 MED ORDER — DOXYCYCLINE HYCLATE 100 MG PO CAPS: 100.0000 mg | ORAL_CAPSULE | Freq: Two times a day (BID) | ORAL | 0 refills | Status: DC

## 2024-03-14 NOTE — Discharge Summary (Signed)
 Physician Discharge Summary   Patient: Gary Lawrence MRN: [id number] DOB: 1960/09/10  Admit date:     03/11/2024  Discharge date: 03/14/24  Discharge Physician: Deliliah Room   PCP: [name], FNP   Recommendations at discharge:    F/u with your PCP in one week. F/u outpatient with neurosurgery for management of cervical spine disease. Continue taking medications as prescribed.  Return to the emergency room if you develop worsening neck pain, fever, chest pain or shortness of breath.  Discharge Diagnoses: Principal Problem:   Cellulitis of neck Active Problems:   Folliculitis   HFrEF (heart failure with reduced ejection fraction) (HCC)   Paroxysmal atrial fibrillation (HCC)   Type 2 diabetes mellitus without complications (HCC)   Essential (primary) hypertension   CAD S/P percutaneous coronary angioplasty   CKD (chronic kidney disease), stage III (HCC)   History of oropharyngeal cancer   OSA on CPAP    Hospital Course:  63 y.o. male with medical history significant of CAD/remote stent, A-fib/flutter on low-dose Xarelto , oropharyngeal cancer, HTN, GERD, NIDDM-2, CKD stage IIIb, history of folliculitis, and OSA on CPAP who presents with a draining cyst on the neck.  CT soft tissue's of the neck did reveal diffuse cellulitis of the left posterior neck with involvement of the superficial musculature and deep cervical fascia without discrete abscess.  He does have severe left C3-C4 foraminal stenosis.  Outpatient cultures grew MRSA.  Patient was started on intravenous vancomycin.  He responded well.  There was no evidence of fever.  He was able to flex, extend and move his neck from side-to-side on the day of the discharge.  He was discharged on oral Zyvox for 14 days, 600 mg p.o. twice daily.  He was advised to follow-up outpatient with PCP in 1 week.  He was also strongly advised to follow-up outpatient with neurosurgery for the management of left C3-C4 severe foraminal  stenosis.       Consultants: None Procedures performed: None Disposition: Home Diet recommendation:  Regular diet DISCHARGE MEDICATION: Allergies as of 03/14/2024       Reactions   Honey Bee Venom Anaphylaxis   Other Cough   Unknown to patient   Milk (cow)    Other reaction(s): VOMITING Other Reaction(s): GI intolerance        Medication List     TAKE these medications    Accu-Chek Guide test strip Generic drug: glucose blood USE 1 STRIP ONCE DAILY FOR 90 DAYS   amiodarone  200 MG tablet Commonly known as: PACERONE  Take 1 tablet (200 mg total) by mouth daily.   apixaban  5 MG Tabs tablet Commonly known as: ELIQUIS  Take 1 tablet (5 mg total) by mouth 2 (two) times daily.   brimonidine  0.15 % ophthalmic solution Commonly known as: ALPHAGAN  Place 1 drop into both eyes 2 (two) times daily.   cycloSPORINE 0.05 % ophthalmic emulsion Commonly known as: RESTASIS Place 1 drop into both eyes 2 (two) times daily.   digoxin  0.125 MG tablet Commonly known as: LANOXIN  Take 1 tablet (0.125 mg total) by mouth daily.   diphenhydrAMINE 25 mg capsule Commonly known as: BENADRYL Take 25 mg by mouth every 6 (six) hours as needed for allergies or itching.   empagliflozin  10 MG Tabs tablet Commonly known as: JARDIANCE  Take 1 tablet (10 mg total) by mouth daily.   fenofibrate 48 MG tablet Commonly known as: TRICOR Take 48 mg by mouth daily.   ferrous sulfate 324 MG Tbec Take 324 mg by mouth  daily.   fluticasone 50 MCG/ACT nasal spray Commonly known as: FLONASE Place 1 spray into both nostrils daily as needed for allergies.   glipiZIDE 2.5 MG 24 hr tablet Commonly known as: GLUCOTROL XL Take 2.5 mg by mouth daily with breakfast.   lidocaine  5 % Commonly known as: LIDODERM  Place 1 patch onto the skin daily. Remove & Discard patch within 12 hours or as directed by MD   linezolid 600 MG tablet Commonly known as: ZYVOX Take 1 tablet (600 mg total) by mouth 2 (two)  times daily.   loratadine 10 MG tablet Commonly known as: CLARITIN Take 10 mg by mouth daily as needed for rhinitis or allergies.   lovastatin 40 MG tablet Commonly known as: MEVACOR Take 40 mg by mouth daily.   metoprolol  succinate 25 MG 24 hr tablet Commonly known as: TOPROL -XL Take 1 tablet (25 mg total) by mouth daily. What changed:  when to take this reasons to take this   pantoprazole  40 MG tablet Commonly known as: PROTONIX  Take 40 mg by mouth daily.   sacubitril -valsartan  24-26 MG Commonly known as: ENTRESTO  Take 1 tablet by mouth 2 (two) times daily.   sildenafil 100 MG tablet Commonly known as: VIAGRA Take 100 mg by mouth as needed for erectile dysfunction.   spironolactone  25 MG tablet Commonly known as: ALDACTONE  Take 0.5 tablets (12.5 mg total) by mouth daily.   Vitamin D3 50 MCG (2000 UT) Tabs Take 2,000 Units by mouth daily.        Follow-up Information     [name], FNP. Schedule an appointment as soon as possible for a visit in 1 week(s).   Specialty: Nurse Practitioner Contact information: [address] [place] [address] [phone]         [facility]. Schedule an appointment as soon as possible for a visit in 1 month(s).   Specialty: Neurosurgery Why: C3-C4 foraminal stenosis Contact information: [address] [place] [address] [phone]                Discharge Exam: Gary Lawrence   03/11/24 0959  Weight: 108 kg   Constitutional: NAD, calm, comfortable Eyes: PERRL, lids and conjunctivae normal ENMT: Mucous membranes are moist. Posterior pharynx clear of any exudate or lesions.Normal dentition.  Neck: Dressing in the back, able to extend, flex as well as move the neck to the right and left without any pain or discomfort Respiratory: clear to auscultation bilaterally, no wheezing, no crackles. Normal respiratory effort. No accessory muscle use.   Cardiovascular: Regular rate and rhythm, no murmurs / rubs / gallops. No extremity edema. 2+ pedal pulses. No carotid bruits.  Abdomen: no tenderness, no masses palpated. No hepatosplenomegaly. Bowel sounds positive.  Musculoskeletal: no clubbing / cyanosis. No joint deformity upper and lower extremities. Good ROM, no contractures. Normal muscle tone.  Skin: no rashes, lesions, ulcers. No induration Neurologic: CN 2-12 grossly intact. Sensation intact, DTR normal. Strength 5/5 x all 4 extremities.  Psychiatric: Normal judgment and insight. Alert and oriented x 3. Normal mood.    Condition at discharge: good  The results of significant diagnostics from this hospitalization (including imaging, microbiology, ancillary and laboratory) are listed below for reference.   Imaging Studies: CT Soft Tissue Neck W Contrast Result Date: 03/11/2024 EXAM: CT NECK WITH CONTRAST 03/11/2024 02:43:11 PM TECHNIQUE: CT of the neck was performed with the administration of 75 mL iohexol  (OMNIPAQUE ) 350 MG/ML injection. Multiplanar reformatted images  are provided for review. Automated exposure control, iterative reconstruction, and/or weight based adjustment of the mA/kV was utilized to reduce the radiation dose to as low as reasonably achievable. COMPARISON: None available. CLINICAL HISTORY: soft tissue infection FINDINGS: AERODIGESTIVE TRACT: No discrete mass. No edema. SALIVARY GLANDS: The parotid and submandibular glands are unremarkable. THYROID: Unremarkable. LYMPH NODES: No suspicious cervical lymphadenopathy. SOFT TISSUES: Diffuse skin thickening and subcutaneous stranding is present in the left posterolateral neck extending 10.9 cm cephalocaudad. The subcutaneous stranding is up to 3.1 cm deep. The area is 9.5 cm wide. There is some indistinctness of the deep cervical fascia and superficial layer of muscles. No discrete abscess is present. BRAIN, ORBITS, SINUSES AND MASTOIDS: No acute abnormality. LUNGS AND  MEDIASTINUM: No acute abnormality. BONES: Multilevel degenerative changes are present in the cervical spine. Uncovertebral and foraminal stenosis is greatest at C4-C5, C5-C6, and C6-C7. Severe left foraminal narrowing is present at C3-C4. IMPRESSION: 1. Diffuse cellulitis of the left posterolateral neck with involvement of the superficial musculature and deep cervical fascia, without a discrete abscess. 2. 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 Electronically signed by: Lonni Necessary MD 03/11/2024 02:52 PM EST RP Workstation: [id number]    Microbiology: Results for orders placed or performed during the hospital encounter of 03/10/24  Aerobic Culture w Gram Stain (superficial specimen)     Status: None   Collection Time: 03/10/24 12:17 PM   Specimen: SCALP  Result Value Ref Range Status   Specimen Description SCALP  Final   Special Requests NONE  Final   Gram Stain   Final    RARE WBC PRESENT, PREDOMINANTLY PMN FEW GRAM POSITIVE COCCI RARE GRAM NEGATIVE RODS Performed at [facility], [address] [place], [place] [address]    Culture   Final    MODERATE METHICILLIN RESISTANT STAPHYLOCOCCUS AUREUS   Report Status 03/12/2024 FINAL  Final   Organism ID, Bacteria METHICILLIN RESISTANT STAPHYLOCOCCUS AUREUS  Final      Susceptibility   Methicillin resistant staphylococcus aureus - MIC*    CIPROFLOXACIN 4 RESISTANT Resistant     ERYTHROMYCIN >=8 RESISTANT Resistant     GENTAMICIN <=0.5 SENSITIVE Sensitive     OXACILLIN >=4 RESISTANT Resistant     TETRACYCLINE <=1 SENSITIVE Sensitive     VANCOMYCIN 1 SENSITIVE Sensitive     TRIMETH/SULFA >=320 RESISTANT Resistant     CLINDAMYCIN <=0.25 SENSITIVE Sensitive     RIFAMPIN <=0.5 SENSITIVE Sensitive     Inducible Clindamycin NEGATIVE Sensitive     LINEZOLID 2 SENSITIVE Sensitive     * MODERATE METHICILLIN RESISTANT STAPHYLOCOCCUS AUREUS    Labs: CBC: Recent Labs  Lab 03/11/24 1122 03/12/24 0602 03/13/24 0459  WBC 8.1 9.2  11.4*  HGB 11.4* 10.6* 11.0*  HCT 35.9* 32.9* 33.7*  MCV 83.9 83.3 82.6  PLT 445* 455* 500*   Basic Metabolic Panel: Recent Labs  Lab 03/11/24 1122 03/12/24 0602 03/13/24 0459  NA 142 142 136  K 4.2 3.8 3.6  CL 104 101 100  CO2 29 26 26   GLUCOSE 131* 114* 122*  BUN 16 15 18   CREATININE 1.66* 1.49* 1.49*  CALCIUM  9.2 9.2 9.2  MG  --   --  1.8   Liver Function Tests: No results for input(s): AST, ALT, ALKPHOS, BILITOT, PROT, ALBUMIN in the last 168 hours. CBG: No results for input(s): GLUCAP in the last 168 hours.  Discharge time spent: 44 minutes.  Signed: Deliliah Room, MD [facility] 03/14/2024

## 2024-03-14 NOTE — TOC Transition Note (Signed)
 Transition of Care [facility]) - Discharge Note   Patient Details  Name: Gary Lawrence MRN: [id number] Date of Birth: 10/25/1960  Transition of Care [facility]) CM/SW Contact:  Roxie KANDICE Stain, RN Phone Number: 03/14/2024, 12:31 PM   Clinical Narrative:    Gary Lawrence is stable to discharge home. Follow up apt on [facility]. No ICM (Inpatient Care Management) needs at this time.    Final next level of care: Home/Self Care Barriers to Discharge: Barriers Resolved   Patient Goals and CMS Choice Patient states their goals for this hospitalization and ongoing recovery are:: return home          Discharge Placement             Home          Discharge Plan and Services Additional resources added to the After Visit Summary for                                       Social Drivers of Health (SDOH) Interventions SDOH Screenings   Food Insecurity: No Food Insecurity (03/11/2024)  Housing: Low Risk  (03/11/2024)  Transportation Needs: No Transportation Needs (03/11/2024)  Utilities: Not At Risk (03/11/2024)  Financial Resource Strain: Not on File (08/13/2021)   Received from [facility]  Physical Activity: Not on File (08/13/2021)   Received from [facility]  Social Connections: Socially Integrated (03/11/2024)  Stress: Not on File (08/13/2021)   Received from [facility]  Tobacco Use: Low Risk  (03/11/2024)     Readmission Risk Interventions    03/14/2024   12:31 PM  Readmission Risk Prevention Plan  Transportation Screening Complete  PCP or Specialist Appt within 5-7 Days Complete  Home Care Screening Complete  Medication Review (RN CM) Not Complete

## 2024-03-14 NOTE — Plan of Care (Signed)

## 2024-04-02 NOTE — Progress Notes (Signed)
 Cardiology Office Note:  .   Date:  04/11/2024  ID:  Gary Lawrence, DOB 05/10/1960, MRN [id number] PCP: [name], FNP  [facility] Providers Cardiologist:  Darryle ONEIDA Decent, MD Cardiology APP:  Rana Lum CROME, NP  Electrophysiologist:  Fonda Kitty, MD   History of Present Illness: .    Chief Complaint  Patient presents with   Follow-up    Gary Lawrence is a 63 y.o. male with below history who presents for follow-up.   History of Present Illness   Gary Lawrence is a 63 year old male with systolic heart failure and atrial fibrillation who presents for follow-up.  He was admitted to the hospital in August for acute systolic heart failure with rapid ventricular response. At that time, his ejection fraction was 35-40%, which has since improved to 60-65% as of December. He was placed on amiodarone  and was evaluated for a repeat ablation.  He feels good overall, with no chest pain, trouble breathing, or swelling. He is currently working two jobs and is a education officer, environmental at [employer], indicating a high level of activity. His heart function has returned to normal.  His current medications include amiodarone  200 mg daily, Eliquis , digoxin , Jardiance  10 mg daily, Entresto , spironolactone , and lovastatin. He was previously on metoprolol , which was stopped, but he is unsure of the specific reason. He was also taken off another diabetic medication and left on glyburide.  Heart issues are common in his family. His sister is currently hospitalized due to heart problems, and both his mother and father passed away from heart-related conditions.  He has a history of stage four cancer and underwent chemotherapy and radiation, during which he continued to work. He attributes his recovery to his faith and determination, despite being given a low chance of survival.           Problem List Systolic HF -EF 35-40% 12/28/2023 -EF 60-65% 04/06/2024 2. Atrial fibrillation/flutter -s/p  ablation -recurrence 12/2023 3. Non-obstructive CAD  4. HLD -T chol 122, HDL 47, LDL 60, TG 73 5. DM 6. HTN 7. OSA 8. CKD 3a    ROS: All other ROS reviewed and negative. Pertinent positives noted in the HPI.     Studies Reviewed: SABRA       TTE 04/06/2024  1. Left ventricular ejection fraction, by estimation, is 60 to 65%. The  left ventricle has normal function. The left ventricle has no regional  wall motion abnormalities. Left ventricular diastolic parameters were  normal.   2. Right ventricular systolic function is normal. The right ventricular  size is normal.   3. The mitral valve is normal in structure. Trivial mitral valve  regurgitation.   4. The aortic valve is tricuspid. Aortic valve regurgitation is not  visualized.    TTE 12/25/2023  1. Left ventricular ejection fraction, by estimation, is 35 to 40%. The  left ventricle has moderately decreased function. The left ventricle  demonstrates global hypokinesis. The left ventricular internal cavity size  was mildly dilated. There is mild  concentric left ventricular hypertrophy. Left ventricular diastolic  parameters are consistent with Grade I diastolic dysfunction (impaired  relaxation). The average left ventricular global longitudinal strain is  -6.7 %. The global longitudinal strain is  abnormal.   2. Right ventricular systolic function is normal. The right ventricular  size is normal. Tricuspid regurgitation signal is inadequate for assessing  PA pressure.   3. Left atrial size was moderately dilated.   4. The mitral valve is normal  in structure. Mild to moderate mitral valve  regurgitation. No evidence of mitral stenosis.   5. The aortic valve is normal in structure. Aortic valve regurgitation is  not visualized. Aortic valve sclerosis is present, with no evidence of  aortic valve stenosis.   6. The inferior vena cava is normal in size with greater than 50%  respiratory variability, suggesting right atrial  pressure of 3 mmHg.   LHC/RHC 12/27/2023   Mid RCA lesion is 30% stenosed.   Ramus lesion is 20% stenosed.   Prox Cx lesion is 30% stenosed.   Dist Cx lesion is 30% stenosed.   Mid LAD to Dist LAD lesion is 20% stenosed.   Mild non-obstructive CAD Mild elevation right and left heart pressures (PCWP 19 mmHg, PA mean 25 mmHg, LVEDP 21 mmHg).    Physical Exam:   VS:  BP (!) 136/92 (BP Location: Left Arm, Patient Position: Sitting)   Pulse 76   Ht 6' 7 (2.007 m)   Wt 225 lb 6.4 oz (102.2 kg)   SpO2 96%   BMI 25.39 kg/m    Wt Readings from Last 3 Encounters:  04/11/24 225 lb 6.4 oz (102.2 kg)  03/11/24 238 lb (108 kg)  01/31/24 217 lb (98.4 kg)    GEN: Well nourished, well developed in no acute distress NECK: No JVD; No carotid bruits CARDIAC: RRR, no murmurs, rubs, gallops RESPIRATORY:  Clear to auscultation without rales, wheezing or rhonchi  ABDOMEN: Soft, non-tender, non-distended EXTREMITIES:  No edema; No deformity  ASSESSMENT AND PLAN: .   Assessment and Plan    Heart failure with recovered ejection fraction Heart function normalized with ejection fraction improved to 60-65%. Asymptomatic with no chest pain, dyspnea, or fluid retention. - Stop digoxin . - Prescribed metoprolol  succinate 25 mg daily. - Continue amiodarone  200 mg daily. - Continue Entresto  24/26 mg BID. - aldactone  12.5 mg daily - jardiance  10 mg daily.  - no volume overload today  Persistent atrial fibrillation status post ablation Persistent atrial fibrillation with previous ablation. Considering repeat ablation due to new catheter technology, which may allow discontinuation of amiodarone . - Referred to Dr. Gail for evaluation for repeat ablation. - Continue amiodarone  200 mg daily. - Continue Eliquis .  Hypertension Blood pressure management discussed in context of medication changes. Suspected higher blood pressure due to digoxin  use. - Monitor blood pressure.  Hyperlipidemia at LDL  goal Hyperlipidemia well controlled with current medication regimen. LDL at goal. - Continue lovastatin 40 mg daily.                Follow-up: Return in about 6 months (around 10/10/2024).  Signed, Darryle DASEN. Barbaraann, MD, [facility]  [facility]  [address] [place], [place] [address] [phone]  10:55 AM

## 2024-04-06 ENCOUNTER — Ambulatory Visit (HOSPITAL_COMMUNITY): Admission: RE | Admit: 2024-04-06 | Discharge: 2024-04-06 | Attending: Emergency Medicine

## 2024-04-06 ENCOUNTER — Telehealth: Payer: Self-pay | Admitting: Pharmacist

## 2024-04-06 DIAGNOSIS — I10 Essential (primary) hypertension: Secondary | ICD-10-CM

## 2024-04-06 DIAGNOSIS — I4891 Unspecified atrial fibrillation: Secondary | ICD-10-CM

## 2024-04-06 DIAGNOSIS — I4819 Other persistent atrial fibrillation: Secondary | ICD-10-CM

## 2024-04-06 DIAGNOSIS — I5020 Unspecified systolic (congestive) heart failure: Secondary | ICD-10-CM

## 2024-04-06 LAB — ECHOCARDIOGRAM COMPLETE
Area-P 1/2: 2.81 cm2
S' Lateral: 3.9 cm

## 2024-04-06 MED ORDER — AMIODARONE HCL 200 MG PO TABS: 200.0000 mg | ORAL_TABLET | Freq: Every day | ORAL | 3 refills | Status: AC

## 2024-04-06 MED ORDER — DIGOXIN 125 MCG PO TABS: 0.1250 mg | ORAL_TABLET | Freq: Every day | ORAL | 3 refills | Status: DC

## 2024-04-06 MED ORDER — SACUBITRIL-VALSARTAN 24-26 MG PO TABS: 1.0000 | ORAL_TABLET | Freq: Two times a day (BID) | ORAL | 3 refills | Status: AC

## 2024-04-06 MED ORDER — SPIRONOLACTONE 25 MG PO TABS: 12.5000 mg | ORAL_TABLET | Freq: Every day | ORAL | 3 refills | Status: AC

## 2024-04-06 MED ORDER — APIXABAN 5 MG PO TABS: 5.0000 mg | ORAL_TABLET | Freq: Two times a day (BID) | ORAL | 2 refills | Status: AC

## 2024-04-06 MED ORDER — EMPAGLIFLOZIN 10 MG PO TABS: 10.0000 mg | ORAL_TABLET | Freq: Every day | ORAL | 3 refills | Status: AC

## 2024-04-06 NOTE — Telephone Encounter (Signed)
-----   Message from Darryle Decent, MD sent at 04/06/2024  9:39 AM EST ----- Regarding: RE: Medication Refil Kryestyn:  Can you assist?  Signed, Darryle DASEN. Decent, MD, [facility]  [facility]  [address] [place], [place] [address] [phone]  9:39 AM ----- Message ----- From: Leander Keen D Sent: 04/06/2024   9:08 AM EST To: Darryle Debby Decent, MD; Lum CROME [place]# Subject: Medication Refil                               Patient needs a STAT refill (requesting 90 days if possible) for ALL Cardiac meds (per Patient).  He states he has been out for at least 2 weeks.  The following list was found in his chart, and he states he needs all of them, sent to [facility] [address] ... Please advise.  -Bethany McMahill, RDCS     Amiodarone  HCl Take 2 tablets (400mg  total) twice daily until 01/02/24,  then take 1 tablet (200mg ) daily from 01/03/24 as per cardiology recommendation.  Apixaban  5 mg Oral 2 times daily  Digoxin  0.125 mg Oral Daily  Empagliflozin  10 mg Oral Daily  Metoprolol  Succinate 25 mg Oral Daily  Sacubitril -Valsartan  1 tablet Oral 2 times daily  Spironolactone  12.5 mg Oral Daily Medication List

## 2024-04-11 ENCOUNTER — Ambulatory Visit: Attending: Cardiovascular Disease | Admitting: Cardiovascular Disease

## 2024-04-11 ENCOUNTER — Encounter: Payer: Self-pay | Admitting: Cardiovascular Disease

## 2024-04-11 VITALS — BP 136/92 | HR 76 | Ht 79.0 in | Wt 225.4 lb

## 2024-04-11 DIAGNOSIS — E785 Hyperlipidemia, unspecified: Secondary | ICD-10-CM | POA: Insufficient documentation

## 2024-04-11 DIAGNOSIS — I4819 Other persistent atrial fibrillation: Secondary | ICD-10-CM | POA: Diagnosis present

## 2024-04-11 DIAGNOSIS — I2510 Atherosclerotic heart disease of native coronary artery without angina pectoris: Secondary | ICD-10-CM | POA: Diagnosis present

## 2024-04-11 DIAGNOSIS — I5020 Unspecified systolic (congestive) heart failure: Secondary | ICD-10-CM | POA: Diagnosis present

## 2024-04-11 MED ORDER — METOPROLOL SUCCINATE ER 25 MG PO TB24: 25.0000 mg | ORAL_TABLET | Freq: Every day | ORAL | Status: AC

## 2024-04-11 NOTE — Progress Notes (Signed)
 Left message at [facility], pt's PCP office regarding medication changes. Pt states that PCP's office provides him with his medications. Return call back requested.

## 2024-04-11 NOTE — Patient Instructions (Addendum)
 Medication Instructions:  Your physician has recommended you make the following change in your medication:  -Stop taking digoxin  (LANOXIN ) 0.125 MG tablet.  Start taking metoprolol  succinate (Toprol -XL) 25mg  once daily.  *If you need a refill on your cardiac medications before your next appointment, please call your pharmacy*   Follow-Up: At [facility], you and your health needs are our priority.  As part of our continuing mission to provide you with exceptional heart care, our providers are all part of one team.  This team includes your primary Cardiologist (physician) and Advanced Practice Providers or APPs (Physician Assistants and Nurse Practitioners) who all work together to provide you with the care you need, when you need it.  Your next appointment:   6 month(s)  Provider:   Darryle ONEIDA Decent, MD    We recommend signing up for the patient portal called MyChart.  Sign up information is provided on this After Visit Summary.  MyChart is used to connect with patients for Virtual Visits (Telemedicine).  Patients are able to view lab/test results, encounter notes, upcoming appointments, etc.  Non-urgent messages can be sent to your provider as well.   To learn more about what you can do with MyChart, go to [email or web address].   Other Instructions Scheduling will reach out to you to schedule an appointment with Dr. Kennyth.

## 2024-04-11 NOTE — Addendum Note (Signed)
 Addended by: LORRENE FEDERICO CROME on: 04/11/2024 01:54 PM   Modules accepted: Orders

## 2024-04-11 NOTE — Progress Notes (Signed)
 Spoke with Izetta at [facility] regarding change in pt's medications. Patient identification verified by 2 forms. Dr. Rosslyn recommendations to stop digoxin  (Lanoxin ) and start taking metoprolol  succinate (toprol -XL) 25mg  once daily. Izetta states she will update pt's medication list in EMR and send a message to pt's provider.

## 2024-05-01 ENCOUNTER — Encounter: Payer: Self-pay | Admitting: Podiatry

## 2024-05-01 ENCOUNTER — Ambulatory Visit: Admitting: Podiatry

## 2024-05-01 VITALS — Ht 79.0 in | Wt 225.0 lb

## 2024-05-01 DIAGNOSIS — M79674 Pain in right toe(s): Secondary | ICD-10-CM | POA: Diagnosis not present

## 2024-05-01 DIAGNOSIS — B351 Tinea unguium: Secondary | ICD-10-CM

## 2024-05-01 DIAGNOSIS — M79675 Pain in left toe(s): Secondary | ICD-10-CM

## 2024-05-01 NOTE — Progress Notes (Signed)
"  °  Subjective:  Patient ID: Gary Lawrence, male    DOB: December 30, 1960,  MRN: [id number]  Chief Complaint  Patient presents with   Nail Problem    RM 2 DFC    64 y.o. male returns with the above complaint. History confirmed with patient.  He has thickened discolored and uncomfortable toenails.  Debridement has been helpful in reducing pain and improving function  Objective:  Physical Exam: warm, good capillary refill, no trophic changes or ulcerative lesions and normal DP and PT pulses.  Decreased protective sensation at tips of toes.  Onychomycosis bilaterally 1, 2, 5 and 3 on the right side.   Pes planus deformity. Assessment:   1. Pain due to onychomycosis of toenails of both feet          Plan:  Patient was evaluated and treated and all questions answered.  Discussed the etiology and treatment options for the condition in detail with the patient.  Recommended debridement of the nails today. Sharp and mechanical debridement performed of all painful and mycotic nails today. Nails debrided in length and thickness using a nail nipper and a mechanical burr to level of comfort. Discussed treatment options including appropriate shoe gear. Follow up as needed for painful nails.     Return in about 3 months (around 07/30/2024) for at risk diabetic foot care.  "

## 2024-05-16 ENCOUNTER — Encounter: Payer: Self-pay | Admitting: Student in an Organized Health Care Education/Training Program

## 2024-05-16 ENCOUNTER — Ambulatory Visit: Attending: Cardiology | Admitting: Student in an Organized Health Care Education/Training Program

## 2024-05-16 VITALS — BP 150/90 | HR 70 | Ht 79.0 in | Wt 226.0 lb

## 2024-05-16 DIAGNOSIS — I4891 Unspecified atrial fibrillation: Secondary | ICD-10-CM | POA: Diagnosis present

## 2024-05-16 DIAGNOSIS — I4819 Other persistent atrial fibrillation: Secondary | ICD-10-CM

## 2024-05-16 DIAGNOSIS — I483 Typical atrial flutter: Secondary | ICD-10-CM | POA: Insufficient documentation

## 2024-05-16 NOTE — Patient Instructions (Signed)

## 2024-05-16 NOTE — Progress Notes (Signed)
 " Cardiology Office Note   Date: 05/16/24 ID:  Delshon Blanchfield, DOB [birth year], MRN [id number] PCP: [name], FNP  [facility] Providers Cardiologist:  Darryle ONEIDA Decent, MD Cardiology APP:  Rana Lum CROME, NP  Electrophysiologist: Adina Primus, MD   History of Present Illness Frederich Montilla is a 63 y.o. male with persistent AF/RVR s/p prior ablation, HFrecEF, non-obstructive CAD, HLD and HTN who presents for arrhythmia management.  Discussed the use of AI scribe software for clinical note transcription with the patient, who gave verbal consent to proceed. History of Present Illness Manly Nestle is a 64 year old male with atrial fibrillation who presents for evaluation of his heart rhythm management. He was referred by Dr. Darryle Decent for evaluation of his atrial fibrillation management.  He has a long-standing history of atrial fibrillation, first diagnosed over 20 years ago.  He said the first episode he had was following multiple stings from yellow jackets. He has been on various medications, including metoprolol , amiodarone , and digoxin , and is currently taking amiodarone  and digoxin , along with the blood thinner Eliquis . He has undergone two ablation procedures, the first in the early 2000s and the second in 2018, neither of which significantly improved his AF burden. He experienced worsening symptoms immediately after the first ablation, requiring hospitalization. Despite these interventions, he continues to experience episodes of atrial fibrillation, although his current medication regimen has been effective in controlling his symptoms. He was recently stabilized at [facility] after experiencing symptoms related to his atrial fibrillation. He reports that he has not experienced significant atrial fibrillation episodes recently and takes his medication twice daily and as needed.  He has a history of thyroid cancer, which was treated successfully at [facility].  He is currently dealing with a boil on his neck, for which he is taking the antibiotic linezolid . He is under the care of his primary doctor for this condition and is scheduled for a follow-up on the 23rd.  He works at [employer] and is concerned about managing his work schedule around his medical appointments and potential procedures.  ROS: palpitations  Studies Reviewed  ECG review 01/06/24: NSR 67, PR 166, QRS 94, QT/c 366/386 12/25/23: (09:31:55) AT 119, PR 160, QRS 84, QT/c 357/503 12/25/23: (04:38:38) SVT with RBBB aberrancy, VR 197, QRS 176, QT/c 300/544 12/24/23: (19:11:11) AFL/VR 78, QRS 88, QT/c 379/432 12/24/23: (11:07:33) NSR 68, PR 165, QRS 93, QT/c 423/450 12/24/23: (09:20:58) AFL/RVR 115, QRS 86, QT/c 346/478 09/12/23: (08:25:32) AF/RVR 121, QRS 80, QT/c 306/435 09/12/23: (08:25:03) AF/RVR 167, QRS 86, QT/c 295/492  TTE Result date: 04/06/24  1. Left ventricular ejection fraction, by estimation, is 60 to 65%. The  left ventricle has normal function. The left ventricle has no regional  wall motion abnormalities. Left ventricular diastolic parameters were  normal.   2. Right ventricular systolic function is normal. The right ventricular  size is normal.   3. The mitral valve is normal in structure. Trivial mitral valve  regurgitation.   4. The aortic valve is tricuspid. Aortic valve regurgitation is not  visualized.   RHC/Coronary angiography  Result date: 12/27/23   Mid RCA lesion is 30% stenosed.   Ramus lesion is 20% stenosed.   Prox Cx lesion is 30% stenosed.   Dist Cx lesion is 30% stenosed.   Mid LAD to Dist LAD lesion is 20% stenosed. Mild non-obstructive CAD Mild elevation right and left heart pressures (PCWP 19 mmHg, PA mean 25 mmHg, LVEDP 21 mmHg).  Medical  management of CAD. Continue diuresis.   Risk Assessment/Calculations  CHA2DS2-VASc Score = 5  This indicates a 7.2% annual risk of stroke. The patient's score is based  upon: CHF History: 1 HTN History: 1 Diabetes History: 1 Stroke History: 0 Vascular Disease History: 1 Age Score: 1 Gender Score: 0   Physical Exam VS:  BP (!) 150/90 (BP Location: Left Arm, Patient Position: Sitting, Cuff Size: Large)   Pulse 70   Ht 6' 7 (2.007 m)   Wt 226 lb (102.5 kg)   SpO2 98%   BMI 25.46 kg/m   Wt Readings from Last 3 Encounters:  05/16/24 226 lb (102.5 kg)  05/01/24 225 lb (102.1 kg)  04/11/24 225 lb 6.4 oz (102.2 kg)    GEN: Well nourished, well developed in no acute distress NECK: No JVD; No carotid bruits CARDIAC: RRR, no murmurs, rubs, gallops RESPIRATORY:  Clear to auscultation without rales, wheezing or rhonchi  ABDOMEN: Soft, non-tender, non-distended EXTREMITIES:  No edema; No deformity  SKIN: Abscess over neck, open but draining/covered  ASSESSMENT AND PLAN Annette Liotta is a 64 y.o. male with persistent AF/RVR s/p prior ablation, HFrecEF, non-obstructive CAD, HLD and HTN who presents for arrhythmia management.  Assessment & Plan Atrial fibrillation Chronic atrial fibrillation controlled with metoprolol , amiodarone , and Eliquis . Previous ablations ineffective. Considering new ablation technology to reduce medication. Risks: bleeding, stroke, heart wall damage. Benefits: potential medication reduction, improved quality of life. Decision pending wound healing and work schedule. - Continue metoprolol , amiodarone , Eliquis . - Consider ablation with new technology post wound healing and work schedule coordination. - Monitor amiodarone  side effects: thyroid, liver, lung issues. - Schedule follow-up in six months.  Type 2 diabetes mellitus Managed with Jardiance  after switching from metformin. - Continue Jardiance  for diabetes management.  Discussed treatment options today for AF including antiarrhythmic drug therapy and ablation. Discussed risks, recovery and likelihood of success with each treatment strategy. Risk, benefits, and alternatives  to EP study and ablation for afib were discussed. These risks include but are not limited to stroke, bleeding, vascular damage, tamponade, perforation, damage to the esophagus, lungs, phrenic nerve and other structures, worsening renal function, coronary vasospasm and death.  Discussed potential need for repeat ablation procedures and antiarrhythmic drugs after an initial ablation. The patient understands these risk and is interested in pursuing ablation but is not sure about timing yet. Additionally he has a neck wound that he is tx with PO abx and is still draining.    Dispo: RTC in 6 months, reasonable candidate for ablation once he gets past infection/boil/abscess on neck.  Signed, Donnice DELENA Primus, MD  "

## 2024-07-31 ENCOUNTER — Ambulatory Visit: Admitting: Podiatry
